# Patient Record
Sex: Female | Born: 1970
Health system: Southern US, Community
[De-identification: ages and names within clinical notes are randomized; demographics above are authoritative.]

## PROBLEM LIST (undated history)

## (undated) DIAGNOSIS — E039 Hypothyroidism, unspecified: Secondary | ICD-10-CM

## (undated) DIAGNOSIS — R7989 Other specified abnormal findings of blood chemistry: Secondary | ICD-10-CM

## (undated) DIAGNOSIS — Z8742 Personal history of other diseases of the female genital tract: Secondary | ICD-10-CM

## (undated) DIAGNOSIS — E538 Deficiency of other specified B group vitamins: Secondary | ICD-10-CM

## (undated) DIAGNOSIS — E559 Vitamin D deficiency, unspecified: Secondary | ICD-10-CM

## (undated) DIAGNOSIS — E282 Polycystic ovarian syndrome: Secondary | ICD-10-CM

## (undated) DIAGNOSIS — D219 Benign neoplasm of connective and other soft tissue, unspecified: Secondary | ICD-10-CM

## (undated) HISTORY — PX: TONSILLECTOMY: SUR1361

## (undated) HISTORY — DX: Vitamin D deficiency, unspecified: E55.9

## (undated) HISTORY — DX: Other specified abnormal findings of blood chemistry: R79.89

## (undated) HISTORY — DX: Hypothyroidism, unspecified: E03.9

## (undated) HISTORY — DX: Polycystic ovarian syndrome: E28.2

## (undated) HISTORY — DX: Personal history of other diseases of the female genital tract: Z87.42

## (undated) HISTORY — DX: Benign neoplasm of connective and other soft tissue, unspecified: D21.9

## (undated) HISTORY — DX: Deficiency of other specified B group vitamins: E53.8

---

## 2006-04-04 ENCOUNTER — Ambulatory Visit (HOSPITAL_COMMUNITY): Admission: RE | Admit: 2006-04-04 | Discharge: 2006-04-04 | Payer: Self-pay | Admitting: Family Medicine

## 2010-04-06 ENCOUNTER — Other Ambulatory Visit (HOSPITAL_COMMUNITY): Payer: Self-pay | Admitting: Internal Medicine

## 2010-04-06 DIAGNOSIS — Z1239 Encounter for other screening for malignant neoplasm of breast: Secondary | ICD-10-CM

## 2010-04-08 ENCOUNTER — Encounter: Payer: Self-pay | Admitting: Family Medicine

## 2010-04-23 ENCOUNTER — Ambulatory Visit (HOSPITAL_COMMUNITY)
Admission: RE | Admit: 2010-04-23 | Discharge: 2010-04-23 | Disposition: A | Discharge status: Home / Self Care | Payer: 59 | Source: Ambulatory Visit | Attending: Internal Medicine | Admitting: Internal Medicine

## 2010-04-23 DIAGNOSIS — Z1231 Encounter for screening mammogram for malignant neoplasm of breast: Secondary | ICD-10-CM | POA: Insufficient documentation

## 2010-04-23 DIAGNOSIS — Z1239 Encounter for other screening for malignant neoplasm of breast: Secondary | ICD-10-CM

## 2010-05-09 ENCOUNTER — Other Ambulatory Visit (HOSPITAL_COMMUNITY): Payer: Self-pay | Admitting: Internal Medicine

## 2010-05-09 DIAGNOSIS — R221 Localized swelling, mass and lump, neck: Secondary | ICD-10-CM

## 2010-05-25 ENCOUNTER — Ambulatory Visit (HOSPITAL_COMMUNITY)
Admission: RE | Admit: 2010-05-25 | Discharge: 2010-05-25 | Disposition: A | Discharge status: Home / Self Care | Payer: 59 | Source: Ambulatory Visit | Attending: Internal Medicine | Admitting: Internal Medicine

## 2010-05-25 DIAGNOSIS — E038 Other specified hypothyroidism: Secondary | ICD-10-CM | POA: Insufficient documentation

## 2010-05-25 DIAGNOSIS — R221 Localized swelling, mass and lump, neck: Secondary | ICD-10-CM

## 2010-05-25 DIAGNOSIS — E049 Nontoxic goiter, unspecified: Secondary | ICD-10-CM | POA: Insufficient documentation

## 2010-11-15 ENCOUNTER — Ambulatory Visit (INDEPENDENT_AMBULATORY_CARE_PROVIDER_SITE_OTHER): Payer: 59 | Admitting: Gastroenterology

## 2010-11-15 ENCOUNTER — Other Ambulatory Visit: Payer: Self-pay | Admitting: Gastroenterology

## 2010-11-15 ENCOUNTER — Encounter: Payer: Self-pay | Admitting: Gastroenterology

## 2010-11-15 VITALS — BP 134/89 | HR 96 | Temp 97.4°F | Ht 63.0 in | Wt 215.4 lb

## 2010-11-15 DIAGNOSIS — R197 Diarrhea, unspecified: Secondary | ICD-10-CM | POA: Insufficient documentation

## 2010-11-15 DIAGNOSIS — K6289 Other specified diseases of anus and rectum: Secondary | ICD-10-CM | POA: Insufficient documentation

## 2010-11-15 NOTE — Patient Instructions (Signed)
You most likely have IBS and had a bowel infection that made things worse.  Continue the probiotic and avoid items that cause bloating, diarrhea, and pain. You will have a blood test to check for celiac sprue. You will have a flexible sigmoidoscopy to check your rectum. Follow up in 3 mos.  Irritable Bowel Syndrome (Spastic Colon) Irritable Bowel Syndrome (IBS) is caused by a disturbance of normal bowel function. Other terms used are spastic colon, mucous colitis, and irritable colon. It does not require surgery, nor does it lead to cancer. There is no cure for IBS. But with proper diet, stress reduction, and medication, you will find that your problems (symptoms) will gradually disappear or improve. IBS is a common digestive disorder. It usually appears in late adolescence or early adulthood. Women develop it twice as often as men.  CAUSES After food has been digested and absorbed in the small intestine, waste material is moved into the colon (large intestine). In the colon, water and salts are absorbed from the undigested products coming from the small intestine. The remaining residue, or fecal material, is held for elimination. Under normal circumstances, gentle, rhythmic contractions on the bowel walls push the fecal material along the colon towards the rectum. In IBS, however, these contractions are irregular and poorly coordinated. The fecal material is either retained too long, resulting in constipation, or expelled too soon, producing diarrhea.  SYMPTOMS  The most common symptom of IBS is pain. It is typically in the lower left side of the belly (abdomen). But it may occur anywhere in the abdomen. It can be felt as heartburn, backache, or even as a dull pain in the arms or shoulders. The pain comes from excessive bowel-muscle spasms and from the buildup of gas and fecal material in the colon. This pain:  Can range from sharp belly (abdominal) cramps to a dull, continuous ache.   Usually  worsens soon after eating.   Is typically relieved by having a bowel movement or passing gas.    Abdominal pain is usually accompanied by constipation. But it may also produce diarrhea. The diarrhea typically occurs right after a meal or upon arising in the morning. The stools are typically soft and watery. They are often flecked with secretions (mucus). Other symptoms of IBS include:  Bloating.  Loss of appetite.   Heartburn.  Feeling sick to your stomach  (nausea).   Belching  Vomiting   Gas.  IBS may also cause a number of symptoms that are unrelated to the digestive system:  Fatigue.  Headaches.   Anxiety  Shortness of breath   Difficulty in concentrating.  Dizziness.   These symptoms tend to come and go.  DIAGNOSIS The symptoms of IBS closely mimic the symptoms of other, more serious digestive disorders. So your caregiver may wish to perform a variety of additional tests to exclude these disorders. He/she wants to be certain of learning what is wrong (diagnosis). The nature and purpose of each test will be explained to you.  TREATMENT A number of medications are available to help correct bowel function and/or relieve bowel spasms and abdominal pain. Among the drugs available are:  Mild, non-irritating laxatives for severe constipation and to help restore normal bowel habits.   Specific anti-diarrheal medications to treat severe or prolonged diarrhea.   Anti-spasmodic agents to relieve intestinal cramps.   HOME CARE INSTRUCTIONS   Avoid foods that are high in fat or oils. Some examples ZOX:WRUEA cream, butter, frankfurters, sausage, and other fatty meats.  Avoid foods that have a laxative effect, such as fruit, fruit juice, and dairy products.   Cut out carbonated drinks, chewing gum, and "gassy" foods, such as beans and cabbage. This may help relieve bloating and belching.   Bran taken with plenty of liquids may help relieve constipation.   Keep track of  what foods seem to trigger your symptoms.   Avoid emotionally charged situations or circumstances that produce anxiety.   Start or continue exercising.   Get plenty of rest and sleep.

## 2010-11-15 NOTE — Progress Notes (Addendum)
Subjective:    Patient ID: Tammy Blankenship, female    DOB: June 21, 1970, 40 y.o.   MRN: [id number]  PCP: [facility]ALL  HPI Sx of bowel irregularity since her 25s. ? Ate bad lettuce and had bad case of diarrhea. Rx: Anucort and Align. Sx improved but then ate nuts and felt swollen, tender, and bloated. When sat down felt like something on the inside of rectum. Dr. Margo Aye felt [facility] and saw ext hemorrhoids. Would have achiness in right buttock and across teh top of her butt. Last 2 days minimal diarrhea and more solid. No internal butt pain. No rectal bleeding. Having abd pain associated with joint pain and now mild left pain. Keeps a food journal-broccoli, squash, Zucchini(can't pass a BM, burp-->diarrhea). Spinach didn't bother her. If eats Mixed green, can see it in her BM. Diarrhea is gone. No well water.  Past Medical History  Diagnosis Date  . Hypothyroidism   . Attention and concentration deficit     Past Surgical History  Procedure Date  . Tonsillectomy     No Known Allergies  Current Outpatient Prescriptions  Medication Sig Dispense Refill  . citalopram (CELEXA) 20 MG tablet Take 20 mg by mouth daily.        Marland Kitchen levothyroxine (SYNTHROID, LEVOTHROID) 75 MCG tablet Take 75 mcg by mouth daily.        . Probiotic Product (ALIGN) 4 MG CAPS Take 4 mg by mouth daily.        . hydrocortisone (ANUSOL-HC) 25 MG suppository Place 25 mg rectally 2 (two) times daily.         Family History  Problem Relation Age of Onset  . Breast cancer Mother   . Rectal cancer Maternal Uncle   . Colon cancer Maternal Uncle     3 uncles    History   Social History  . Marital Status: Single    Spouse Name: N/A    Number of Children: N/A  . Years of Education: N/A   Occupational History  . Not on file.   Social History Main Topics  . Smoking status: Former Smoker    Types: Cigarettes  . Smokeless tobacco: Former Neurosurgeon    Quit date: 11/14/2008   Comment: 3-4 cigarttes a day  . Alcohol Use: Yes     evey  other month  . Drug Use: No  . Sexually Active: Not on file   Other Topics Concern  . Not on file   Social History Narrative   No kids. Has a long ter partner. Works for [employer].        Review of Systems  All other systems reviewed and are negative.  JAN 2012 TSH 6.398H-->APR 2012 2.167 NOV 2011-NL CMP, CBC(PLT 262)     Objective:   Physical Exam  Vitals reviewed. Constitutional: She is oriented to person, place, and time. She appears well-developed and well-nourished. No distress.  HENT:  Head: Normocephalic and atraumatic.  Mouth/Throat: Oropharynx is clear and moist. No oropharyngeal exudate.  Eyes: Pupils are equal, round, and reactive to light. No scleral icterus.  Neck: Normal range of motion. Neck supple.  Cardiovascular: Normal rate, regular rhythm and normal heart sounds.   Pulmonary/Chest: Effort normal and breath sounds normal.  Abdominal: Soft. Bowel sounds are normal. She exhibits no distension. There is no tenderness.  Musculoskeletal: Normal range of motion. She exhibits no edema.  Lymphadenopathy:    She has no cervical adenopathy.  Neurological: She is alert and oriented to person, place, and time.  No focal deficits  Psychiatric: She has a normal mood and affect.          Assessment & Plan:

## 2010-11-15 NOTE — Progress Notes (Signed)
Pt aware of OV for 11/28 at 0930 with SF

## 2010-11-15 NOTE — Assessment & Plan Note (Signed)
Most likely 2o to enteric infection followed by IBS flare. Sx improved.  Continue probiotic. TTG IGA to test for celiac sprue. OPV in 3 mos.

## 2010-11-15 NOTE — Assessment & Plan Note (Signed)
Most likely 2o to hemorrhoids. Less likely rectal mass.  FLEX SIG IN SEP 2012. USE ANUCORT PRN. HIGH FIBER DIET. AVOID ITEMS THAT CAUSE BLOATING, DIARRHEA, AND PAIN.

## 2010-11-16 LAB — TISSUE TRANSGLUTAMINASE, IGA: Tissue Transglutaminase Ab, IgA: 4.1 U/mL (ref <20)

## 2010-11-20 NOTE — Progress Notes (Signed)
Cc to PCP 

## 2010-11-22 NOTE — Progress Notes (Signed)
Quick Note:  LMOM to call. ______ 

## 2010-12-13 MED ORDER — SODIUM CHLORIDE 0.45 % IV SOLN
Freq: Once | INTRAVENOUS | Status: AC
  Administered 2010-12-14: 08:00:00 via INTRAVENOUS

## 2010-12-14 ENCOUNTER — Encounter (HOSPITAL_COMMUNITY)
Admission: RE | Disposition: A | Discharge status: Home / Self Care | Payer: Self-pay | Source: Ambulatory Visit | Attending: Gastroenterology

## 2010-12-14 ENCOUNTER — Encounter (HOSPITAL_COMMUNITY): Payer: Self-pay

## 2010-12-14 ENCOUNTER — Other Ambulatory Visit: Payer: Self-pay | Admitting: Gastroenterology

## 2010-12-14 ENCOUNTER — Ambulatory Visit (HOSPITAL_COMMUNITY)
Admission: RE | Admit: 2010-12-14 | Discharge: 2010-12-14 | Disposition: A | Discharge status: Home / Self Care | Payer: 59 | Source: Ambulatory Visit | Attending: Gastroenterology | Admitting: Gastroenterology

## 2010-12-14 DIAGNOSIS — K6289 Other specified diseases of anus and rectum: Secondary | ICD-10-CM | POA: Insufficient documentation

## 2010-12-14 DIAGNOSIS — K648 Other hemorrhoids: Secondary | ICD-10-CM | POA: Insufficient documentation

## 2010-12-14 DIAGNOSIS — K5730 Diverticulosis of large intestine without perforation or abscess without bleeding: Secondary | ICD-10-CM

## 2010-12-14 DIAGNOSIS — R197 Diarrhea, unspecified: Secondary | ICD-10-CM | POA: Insufficient documentation

## 2010-12-14 DIAGNOSIS — D128 Benign neoplasm of rectum: Secondary | ICD-10-CM | POA: Insufficient documentation

## 2010-12-14 DIAGNOSIS — D126 Benign neoplasm of colon, unspecified: Secondary | ICD-10-CM

## 2010-12-14 DIAGNOSIS — D129 Benign neoplasm of anus and anal canal: Secondary | ICD-10-CM | POA: Insufficient documentation

## 2010-12-14 HISTORY — PX: FLEXIBLE SIGMOIDOSCOPY: SHX5431

## 2010-12-14 SURGERY — SIGMOIDOSCOPY, FLEXIBLE
Anesthesia: Moderate Sedation

## 2010-12-14 MED ORDER — MEPERIDINE HCL 100 MG/ML IJ SOLN
INTRAMUSCULAR | Status: DC | PRN
  Administered 2010-12-14: 25 mg
  Administered 2010-12-14: 50 mg

## 2010-12-14 MED ORDER — MIDAZOLAM HCL 5 MG/5ML IJ SOLN
INTRAMUSCULAR | Status: AC
  Filled 2010-12-14: qty 10

## 2010-12-14 MED ORDER — MIDAZOLAM HCL 5 MG/5ML IJ SOLN
INTRAMUSCULAR | Status: DC | PRN
  Administered 2010-12-14 (×2): 2 mg via INTRAVENOUS

## 2010-12-14 MED ORDER — MEPERIDINE HCL 100 MG/ML IJ SOLN
INTRAMUSCULAR | Status: AC
  Filled 2010-12-14: qty 2

## 2010-12-14 NOTE — Interval H&P Note (Signed)
History and Physical Interval Note:   12/14/2010   8:36 AM   [name]  has presented today for surgery, with the diagnosis of rectal pain & diarrhea  The various methods of treatment have been discussed with the patient and family. After consideration of risks, benefits and other options for treatment, the patient has consented to  Procedure(s): FLEXIBLE SIGMOIDOSCOPY as a surgical intervention .  I have reviewed the patients' chart and labs.  Questions were answered to the patient's satisfaction.     Jonette Eva  MD

## 2010-12-14 NOTE — H&P (Signed)
Reason for Visit     Hemorrhoids        Vitals - Last Recorded       BP Pulse Temp(Src) Ht Wt BMI    134/89  96  97.4 F (36.3 C) (Temporal)  5\' 3"  (1.6 m)  215 lb 6.4 oz (97.705 kg)  38.16 kg/m2          LMP              12/10/2010                    Progress Notes     Jonette Eva, MD  11/22/2010  1:07 PM  Addendum    Subjective:      Patient ID: Tammy Blankenship, female    DOB: 1970-03-20, 40 y.o.   MRN: [id number]   PCP: [facility]   HPI Sx of bowel irregularity since her 64s. ? Ate bad lettuce and had bad case of diarrhea. Rx: Anucort and Align. Sx improved but then ate nuts and felt swollen, tender, and bloated. When sat down felt like something on the inside of rectum. Dr. Margo Aye felt [facility] and saw ext hemorrhoids. Would have achiness in right buttock and across teh top of her butt. Last 2 days minimal diarrhea and more solid. No internal butt pain. No rectal bleeding. Having abd pain associated with joint pain and now mild left pain. Keeps a food journal-broccoli, squash, Zucchini(can't pass a BM, burp-->diarrhea). Spinach didn't bother her. If eats Mixed green, can see it in her BM. Diarrhea is gone. No well water.    Past Medical History   Diagnosis  Date   .  Hypothyroidism     .  Attention and concentration deficit         Past Surgical History   Procedure  Date   .  Tonsillectomy        No Known Allergies    Current Outpatient Prescriptions   Medication  Sig  Dispense  Refill   .  citalopram (CELEXA) 20 MG tablet  Take 20 mg by mouth daily.           Marland Kitchen  levothyroxine (SYNTHROID, LEVOTHROID) 75 MCG tablet  Take 75 mcg by mouth daily.           .  Probiotic Product (ALIGN) 4 MG CAPS  Take 4 mg by mouth daily.           .  hydrocortisone (ANUSOL-HC) 25 MG suppository  Place 25 mg rectally 2 (two) times daily.            Family History   Problem  Relation  Age of Onset   .  Breast cancer  Mother     .  Rectal cancer  Maternal Uncle     .  Colon cancer   Maternal Uncle         3 uncles       History       Social History   .  Marital Status:  Single       Spouse Name:  N/A       Number of Children:  N/A   .  Years of Education:  N/A       Occupational History   .  Not on file.       Social History Main Topics   .  Smoking status:  Former Smoker       Types:  Cigarettes   .  Smokeless tobacco:  Former Neurosurgeon       Quit date:  11/14/2008     Comment: 3-4 cigarttes a day   .  Alcohol Use:  Yes         evey other month   .  Drug Use:  No   .  Sexually Active:  Not on file       Other Topics  Concern   .  Not on file       Social History Narrative     No kids. Has a long ter partner. Works for [employer].              Review of Systems  All other systems reviewed and are negative. JAN 2012 TSH 6.398H-->APR 2012 2.167 NOV 2011-NL CMP, CBC(PLT 262)     Objective:    Physical Exam  Vitals reviewed. Constitutional: She is oriented to person, place, and time. She appears well-developed and well-nourished. No distress.  HENT:   Head: Normocephalic and atraumatic.   Mouth/Throat: Oropharynx is clear and moist. No oropharyngeal exudate.  Eyes: Pupils are equal, round, and reactive to light. No scleral icterus.  Neck: Normal range of motion. Neck supple.  Cardiovascular: Normal rate, regular rhythm and normal heart sounds.   Pulmonary/Chest: Effort normal and breath sounds normal.  Abdominal: Soft. Bowel sounds are normal. She exhibits no distension. There is no tenderness.  Musculoskeletal: Normal range of motion. She exhibits no edema.  Lymphadenopathy:    She has no cervical adenopathy.  Neurological: She is alert and oriented to person, place, and time.       No focal deficits  Psychiatric: She has a normal mood and affect.            Assessment & Plan:        Previous Version  [name]  11/15/2010  3:20 PM  Signed Pt aware of OV for 11/28 at 0930 with SF  Leigh A Watson  11/20/2010 12:15 PM   Signed Cc to PCP  Cloria Spring, LPN, LPN  0/11/8117  1:47 PM  Signed Quick Note:   LMOM to call. ______        Diarrhea - Jonette Eva, MD  11/15/2010  9:33 AM  Signed Most likely 2o to enteric infection followed by IBS flare. Sx improved.   Continue probiotic. TTG IGA to test for celiac sprue. OPV in 3 mos.  Rectal pain - Jonette Eva, MD  11/15/2010  9:34 AM  Signed Most likely 2o to hemorrhoids. Less likely rectal mass.   FLEX SIG IN SEP 2012. USE ANUCORT PRN. HIGH FIBER DIET. AVOID ITEMS THAT CAUSE BLOATING, DIARRHEA, AND PAIN.

## 2010-12-20 ENCOUNTER — Telehealth: Payer: Self-pay | Admitting: Gastroenterology

## 2010-12-20 NOTE — Telephone Encounter (Signed)
Please call pt. She had HYPERPLASTIC POLYP removed from her colon. Her colon biopsies are normal. TCS in 9 years. High fiber diet. OPV NOV 2012,

## 2010-12-21 NOTE — Telephone Encounter (Signed)
Results Cc to PCP & 9 yr reminder is nicd in the computer

## 2010-12-24 ENCOUNTER — Encounter (HOSPITAL_COMMUNITY): Payer: Self-pay | Admitting: Gastroenterology

## 2010-12-24 NOTE — Telephone Encounter (Signed)
L/M to call.

## 2010-12-24 NOTE — Telephone Encounter (Signed)
Pt informed

## 2011-02-13 ENCOUNTER — Ambulatory Visit: Payer: 59 | Admitting: Gastroenterology

## 2011-02-13 ENCOUNTER — Telehealth: Payer: Self-pay | Admitting: Gastroenterology

## 2011-02-13 NOTE — Telephone Encounter (Signed)
REVIEWED.  

## 2011-02-13 NOTE — Telephone Encounter (Signed)
Pt was a no show

## 2011-05-13 ENCOUNTER — Other Ambulatory Visit (HOSPITAL_COMMUNITY): Payer: Self-pay | Admitting: Internal Medicine

## 2011-05-13 DIAGNOSIS — Z139 Encounter for screening, unspecified: Secondary | ICD-10-CM

## 2011-05-23 ENCOUNTER — Ambulatory Visit (HOSPITAL_COMMUNITY)
Admission: RE | Admit: 2011-05-23 | Discharge: 2011-05-23 | Disposition: A | Discharge status: Home / Self Care | Payer: 59 | Source: Ambulatory Visit | Attending: Internal Medicine | Admitting: Internal Medicine

## 2011-05-23 DIAGNOSIS — Z1231 Encounter for screening mammogram for malignant neoplasm of breast: Secondary | ICD-10-CM | POA: Insufficient documentation

## 2011-05-23 DIAGNOSIS — Z139 Encounter for screening, unspecified: Secondary | ICD-10-CM

## 2013-08-27 ENCOUNTER — Other Ambulatory Visit (HOSPITAL_COMMUNITY): Payer: Self-pay | Admitting: Internal Medicine

## 2013-08-27 DIAGNOSIS — Z1231 Encounter for screening mammogram for malignant neoplasm of breast: Secondary | ICD-10-CM

## 2013-08-30 ENCOUNTER — Ambulatory Visit (HOSPITAL_COMMUNITY)
Admission: RE | Admit: 2013-08-30 | Discharge: 2013-08-30 | Disposition: A | Discharge status: Home / Self Care | Payer: 59 | Source: Ambulatory Visit | Attending: Internal Medicine | Admitting: Internal Medicine

## 2013-08-30 DIAGNOSIS — Z1231 Encounter for screening mammogram for malignant neoplasm of breast: Secondary | ICD-10-CM | POA: Insufficient documentation

## 2013-10-26 ENCOUNTER — Other Ambulatory Visit (HOSPITAL_COMMUNITY): Payer: Self-pay | Admitting: Internal Medicine

## 2013-10-26 DIAGNOSIS — N946 Dysmenorrhea, unspecified: Secondary | ICD-10-CM

## 2013-10-29 ENCOUNTER — Ambulatory Visit (HOSPITAL_COMMUNITY)
Admission: RE | Admit: 2013-10-29 | Discharge: 2013-10-29 | Disposition: A | Discharge status: Home / Self Care | Payer: 59 | Source: Ambulatory Visit | Attending: Internal Medicine | Admitting: Internal Medicine

## 2013-10-29 DIAGNOSIS — N839 Noninflammatory disorder of ovary, fallopian tube and broad ligament, unspecified: Secondary | ICD-10-CM | POA: Insufficient documentation

## 2013-10-29 DIAGNOSIS — N946 Dysmenorrhea, unspecified: Secondary | ICD-10-CM | POA: Diagnosis not present

## 2013-11-30 ENCOUNTER — Other Ambulatory Visit (HOSPITAL_COMMUNITY): Payer: Self-pay | Admitting: Internal Medicine

## 2013-11-30 DIAGNOSIS — N83209 Unspecified ovarian cyst, unspecified side: Secondary | ICD-10-CM

## 2013-12-13 ENCOUNTER — Other Ambulatory Visit (HOSPITAL_COMMUNITY): Payer: Self-pay | Admitting: Internal Medicine

## 2013-12-13 DIAGNOSIS — N83209 Unspecified ovarian cyst, unspecified side: Secondary | ICD-10-CM

## 2013-12-27 ENCOUNTER — Ambulatory Visit (HOSPITAL_COMMUNITY): Admission: RE | Admit: 2013-12-27 | Payer: 59 | Source: Ambulatory Visit

## 2013-12-27 ENCOUNTER — Other Ambulatory Visit (HOSPITAL_COMMUNITY): Payer: 59

## 2014-01-04 ENCOUNTER — Other Ambulatory Visit (HOSPITAL_COMMUNITY): Payer: Self-pay | Admitting: Emergency Medicine

## 2014-01-04 ENCOUNTER — Ambulatory Visit (HOSPITAL_COMMUNITY)
Admission: RE | Admit: 2014-01-04 | Discharge: 2014-01-04 | Disposition: A | Discharge status: Home / Self Care | Payer: 59 | Source: Ambulatory Visit | Attending: Internal Medicine | Admitting: Internal Medicine

## 2014-01-04 DIAGNOSIS — R609 Edema, unspecified: Secondary | ICD-10-CM

## 2014-01-04 DIAGNOSIS — M25572 Pain in left ankle and joints of left foot: Secondary | ICD-10-CM

## 2014-01-04 DIAGNOSIS — M7732 Calcaneal spur, left foot: Secondary | ICD-10-CM | POA: Insufficient documentation

## 2014-10-25 ENCOUNTER — Other Ambulatory Visit (HOSPITAL_COMMUNITY): Payer: Self-pay | Admitting: Internal Medicine

## 2014-10-25 DIAGNOSIS — Z1231 Encounter for screening mammogram for malignant neoplasm of breast: Secondary | ICD-10-CM

## 2014-11-02 ENCOUNTER — Ambulatory Visit (HOSPITAL_COMMUNITY)
Admission: RE | Admit: 2014-11-02 | Discharge: 2014-11-02 | Disposition: A | Discharge status: Home / Self Care | Payer: 59 | Source: Ambulatory Visit | Attending: Internal Medicine | Admitting: Internal Medicine

## 2014-11-02 DIAGNOSIS — Z1231 Encounter for screening mammogram for malignant neoplasm of breast: Secondary | ICD-10-CM | POA: Diagnosis present

## 2015-04-12 MED FILL — CITALOPRAM HBR 40 MG TABLET: 40 | 30 days supply | Qty: 30 | Fill #2

## 2015-09-15 ENCOUNTER — Other Ambulatory Visit (HOSPITAL_COMMUNITY)
Admission: RE | Admit: 2015-09-15 | Discharge: 2015-09-15 | Disposition: A | Discharge status: Home / Self Care | Payer: 59 | Source: Ambulatory Visit | Attending: Adult Health | Admitting: Adult Health

## 2015-09-15 ENCOUNTER — Encounter: Payer: Self-pay | Admitting: Adult Health

## 2015-09-15 ENCOUNTER — Ambulatory Visit (INDEPENDENT_AMBULATORY_CARE_PROVIDER_SITE_OTHER): Payer: 59 | Admitting: Adult Health

## 2015-09-15 VITALS — BP 148/84 | HR 80 | Ht 61.5 in | Wt 194.0 lb

## 2015-09-15 DIAGNOSIS — Z01419 Encounter for gynecological examination (general) (routine) without abnormal findings: Secondary | ICD-10-CM

## 2015-09-15 DIAGNOSIS — Z1212 Encounter for screening for malignant neoplasm of rectum: Secondary | ICD-10-CM | POA: Diagnosis not present

## 2015-09-15 DIAGNOSIS — Z01411 Encounter for gynecological examination (general) (routine) with abnormal findings: Secondary | ICD-10-CM | POA: Diagnosis not present

## 2015-09-15 DIAGNOSIS — Z8742 Personal history of other diseases of the female genital tract: Secondary | ICD-10-CM

## 2015-09-15 DIAGNOSIS — Z124 Encounter for screening for malignant neoplasm of cervix: Secondary | ICD-10-CM | POA: Diagnosis not present

## 2015-09-15 DIAGNOSIS — Z1151 Encounter for screening for human papillomavirus (HPV): Secondary | ICD-10-CM | POA: Insufficient documentation

## 2015-09-15 HISTORY — DX: Personal history of other diseases of the female genital tract: Z87.42

## 2015-09-15 LAB — HEMOCCULT GUIAC POC 1CARD (OFFICE): Fecal Occult Blood, POC: NEGATIVE

## 2015-09-15 NOTE — Progress Notes (Signed)
Patient ID: Tammy Blankenship, female   DOB: 04/30/1970, 45 y.o.   MRN: [id number] History of Present Illness: Tammy Blankenship is a 45 year old white female, single in for a well woman gyn exam and pap.She is in RT at [facility] PCP is Tammy Hall,MD.   Current Medications, Allergies, Past Medical History, Past Surgical History, Family History and Social History were reviewed in [employer] record.     Review of Systems: Patient denies any headaches, hearing loss, fatigue, blurred vision, shortness of breath, chest pain, abdominal pain, problems with bowel movements, urination, or intercourse. No joint pain or mood swings.Periods still every month, may spot  After bleeding slowed, and occasional cramp,had ovarian cyst in 2015 without F/U US, has been told had PCO.    Physical Exam:BP 148/84 mmHg  Pulse 80  Ht 5' 1.5" (1.562 m)  Wt 194 lb (87.998 kg)  BMI 36.07 kg/m2  LMP 08/31/2015 General:  Well developed, well nourished, no acute distress Skin:  Warm and dry,has increased hair growth on arms, and stomach  Neck:  Midline trachea, normal thyroid, good ROM, no lymphadenopathy Lungs; Clear to auscultation bilaterally Breast:  No dominant palpable mass, retraction, or nipple discharge Cardiovascular: Regular rate and rhythm Abdomen:  Soft, non tender, no hepatosplenomegaly Pelvic:  External genitalia is normal in appearance, no lesions.  The vagina is normal in appearance. Urethra has no lesions or masses. The cervix is smooth,pap with HPV performed.  Uterus is felt to be normal size, shape, and contour.  No adnexal masses or tenderness noted.Bladder is non tender, no masses felt. Rectal: Good sphincter tone, no polyps, or hemorrhoids felt.  Hemoccult negative. Extremities/musculoskeletal:  No swelling or varicosities noted, no clubbing or cyanosis Psych:  No mood changes, alert and cooperative,seems happy   Impression: Well woman gyn exam and pap History of ovarian  cyst    Plan: Check CBC,CMP,TSH and lipids,A1c and vitamin D Physical in 1 year,pap in 3 if normal Mammogram yearly Colonoscopy at 20

## 2015-09-15 NOTE — Patient Instructions (Addendum)
Physical in 1 year,pap in 3 if normal Mammogram yearly  Return for [place] in 1 week  Ovarian Cyst An ovarian cyst is a fluid-filled sac that forms on an ovary. The ovaries are small organs that produce eggs in women. Various types of cysts can form on the ovaries. Most are not cancerous. Many do not cause problems, and they often go away on their own. Some may cause symptoms and require treatment. Common types of ovarian cysts include:  Functional cysts--These cysts may occur every month during the menstrual cycle. This is normal. The cysts usually go away with the next menstrual cycle if the woman does not get pregnant. Usually, there are no symptoms with a functional cyst.  Endometrioma cysts--These cysts form from the tissue that lines the uterus. They are also called "chocolate cysts" because they become filled with blood that turns brown. This type of cyst can cause pain in the lower abdomen during intercourse and with your menstrual period.  Cystadenoma cysts--This type develops from the cells on the outside of the ovary. These cysts can get very big and cause lower abdomen pain and pain with intercourse. This type of cyst can twist on itself, cut off its blood supply, and cause severe pain. It can also easily rupture and cause a lot of pain.  Dermoid cysts--This type of cyst is sometimes found in both ovaries. These cysts may contain different kinds of body tissue, such as skin, teeth, hair, or cartilage. They usually do not cause symptoms unless they get very big.  Theca lutein cysts--These cysts occur when too much of a certain hormone (human chorionic gonadotropin) is produced and overstimulates the ovaries to produce an egg. This is most common after procedures used to assist with the conception of a baby (in vitro fertilization). CAUSES   Fertility drugs can cause a condition in which multiple large cysts are formed on the ovaries. This is called ovarian hyperstimulation syndrome.  A  condition called polycystic ovary syndrome can cause hormonal imbalances that can lead to nonfunctional ovarian cysts. SIGNS AND SYMPTOMS  Many ovarian cysts do not cause symptoms. If symptoms are present, they may include:  Pelvic pain or pressure.  Pain in the lower abdomen.  Pain during sexual intercourse.  Increasing girth (swelling) of the abdomen.  Abnormal menstrual periods.  Increasing pain with menstrual periods.  Stopping having menstrual periods without being pregnant. DIAGNOSIS  These cysts are commonly found during a routine or annual pelvic exam. Tests may be ordered to find out more about the cyst. These tests may include:  Ultrasound.  X-ray of the pelvis.  CT scan.  MRI.  Blood tests. TREATMENT  Many ovarian cysts go away on their own without treatment. Your health care provider may want to check your cyst regularly for 2-3 months to see if it changes. For women in menopause, it is particularly important to monitor a cyst closely because of the higher rate of ovarian cancer in menopausal women. When treatment is needed, it may include any of the following:  A procedure to drain the cyst (aspiration). This may be done using a long needle and ultrasound. It can also be done through a laparoscopic procedure. This involves using a thin, lighted tube with a tiny camera on the end (laparoscope) inserted through a small incision.  Surgery to remove the whole cyst. This may be done using laparoscopic surgery or an open surgery involving a larger incision in the lower abdomen.  Hormone treatment or birth control pills.  These methods are sometimes used to help dissolve a cyst. HOME CARE INSTRUCTIONS   Only take over-the-counter or prescription medicines as directed by your health care provider.  Follow up with your health care provider as directed.  Get regular pelvic exams and Pap tests. SEEK MEDICAL CARE IF:   Your periods are late, irregular, or painful, or  they stop.  Your pelvic pain or abdominal pain does not go away.  Your abdomen becomes larger or swollen.  You have pressure on your bladder or trouble emptying your bladder completely.  You have pain during sexual intercourse.  You have feelings of fullness, pressure, or discomfort in your stomach.  You lose weight for no apparent reason.  You feel generally ill.  You become constipated.  You lose your appetite.  You develop acne.  You have an increase in body and facial hair.  You are gaining weight, without changing your exercise and eating habits.  You think you are pregnant. SEEK IMMEDIATE MEDICAL CARE IF:   You have increasing abdominal pain.  You feel sick to your stomach (nauseous), and you throw up (vomit).  You develop a fever that comes on suddenly.  You have abdominal pain during a bowel movement.  Your menstrual periods become heavier than usual. MAKE SURE YOU:  Understand these instructions.  Will watch your condition.  Will get help right away if you are not doing well or get worse.   This information is not intended to replace advice given to you by your health care provider. Make sure you discuss any questions you have with your health care provider.   Document Released: 03/04/2005 Document Revised: 03/09/2013 Document Reviewed: 11/09/2012 Elsevier Interactive Patient Education [employer].

## 2015-09-16 LAB — HEMOGLOBIN A1C
Est. average glucose Bld gHb Est-mCnc: 105 mg/dL
Hgb A1c MFr Bld: 5.3 % (ref 4.8–5.6)

## 2015-09-16 LAB — LIPID PANEL
CHOL/HDL RATIO: 3.5 ratio (ref 0.0–4.4)
Cholesterol, Total: 188 mg/dL (ref 100–199)
HDL: 54 mg/dL (ref >39)
LDL CALC: 112 mg/dL — AB (ref 0–99)
TRIGLYCERIDES: 112 mg/dL (ref 0–149)
VLDL CHOLESTEROL CAL: 22 mg/dL (ref 5–40)

## 2015-09-16 LAB — CBC
HEMATOCRIT: 43.4 % (ref 34.0–46.6)
Hemoglobin: 14.9 g/dL (ref 11.1–15.9)
MCH: 30.2 pg (ref 26.6–33.0)
MCHC: 34.3 g/dL (ref 31.5–35.7)
MCV: 88 fL (ref 79–97)
PLATELETS: 297 10*3/uL (ref 150–379)
RBC: 4.94 x10E6/uL (ref 3.77–5.28)
RDW: 13.4 % (ref 12.3–15.4)
WBC: 6.3 10*3/uL (ref 3.4–10.8)

## 2015-09-16 LAB — COMPREHENSIVE METABOLIC PANEL
A/G RATIO: 1.5 (ref 1.2–2.2)
ALT: 8 IU/L (ref 0–32)
AST: 16 IU/L (ref 0–40)
Albumin: 4 g/dL (ref 3.5–5.5)
Alkaline Phosphatase: 55 IU/L (ref 39–117)
BILIRUBIN TOTAL: 0.4 mg/dL (ref 0.0–1.2)
BUN/Creatinine Ratio: 10 (ref 9–23)
BUN: 7 mg/dL (ref 6–24)
CALCIUM: 9.1 mg/dL (ref 8.7–10.2)
CHLORIDE: 100 mmol/L (ref 96–106)
CO2: 23 mmol/L (ref 18–29)
Creatinine, Ser: 0.7 mg/dL (ref 0.57–1.00)
GFR, EST AFRICAN AMERICAN: 121 mL/min/{1.73_m2} (ref >59)
GFR, EST NON AFRICAN AMERICAN: 105 mL/min/{1.73_m2} (ref >59)
GLOBULIN, TOTAL: 2.7 g/dL (ref 1.5–4.5)
Glucose: 90 mg/dL (ref 65–99)
POTASSIUM: 4.7 mmol/L (ref 3.5–5.2)
SODIUM: 140 mmol/L (ref 134–144)
TOTAL PROTEIN: 6.7 g/dL (ref 6.0–8.5)

## 2015-09-16 LAB — TSH: TSH: 3.69 u[IU]/mL (ref 0.450–4.500)

## 2015-09-16 LAB — VITAMIN D 25 HYDROXY (VIT D DEFICIENCY, FRACTURES): Vit D, 25-Hydroxy: 24.7 ng/mL — ABNORMAL LOW (ref 30.0–100.0)

## 2015-09-18 ENCOUNTER — Telehealth: Payer: Self-pay | Admitting: Adult Health

## 2015-09-18 LAB — CYTOLOGY - PAP

## 2015-09-18 NOTE — Telephone Encounter (Signed)
Left message about labs and take vitamin D3 2000- 5000 IU daily

## 2015-09-22 ENCOUNTER — Ambulatory Visit (INDEPENDENT_AMBULATORY_CARE_PROVIDER_SITE_OTHER): Payer: 59

## 2015-09-22 ENCOUNTER — Encounter: Payer: Self-pay | Admitting: Adult Health

## 2015-09-22 ENCOUNTER — Telehealth: Payer: Self-pay | Admitting: Adult Health

## 2015-09-22 DIAGNOSIS — D219 Benign neoplasm of connective and other soft tissue, unspecified: Secondary | ICD-10-CM

## 2015-09-22 DIAGNOSIS — Z8742 Personal history of other diseases of the female genital tract: Secondary | ICD-10-CM | POA: Diagnosis not present

## 2015-09-22 DIAGNOSIS — D252 Subserosal leiomyoma of uterus: Secondary | ICD-10-CM

## 2015-09-22 DIAGNOSIS — N854 Malposition of uterus: Secondary | ICD-10-CM | POA: Diagnosis not present

## 2015-09-22 HISTORY — DX: Benign neoplasm of connective and other soft tissue, unspecified: D21.9

## 2015-09-22 NOTE — Progress Notes (Signed)
US PELVIC US TA/TV: Heterogenous anteverted uterus w a subserosal fundal fibroid 2.1 x 1.8 x 2.3 cm,normal ov's bilat (mobile),EEC 14.6 mm,mult simple nabothian cysts,small amount of cul de sac fluid

## 2015-09-22 NOTE — Telephone Encounter (Signed)
Left message that cyst gone on ovary and they were normal has small fundal fibroid

## 2015-09-25 ENCOUNTER — Other Ambulatory Visit: Payer: 59

## 2015-10-09 ENCOUNTER — Other Ambulatory Visit (HOSPITAL_COMMUNITY): Payer: Self-pay | Admitting: Internal Medicine

## 2015-10-09 ENCOUNTER — Other Ambulatory Visit: Payer: Self-pay | Admitting: Adult Health

## 2015-10-09 DIAGNOSIS — Z1231 Encounter for screening mammogram for malignant neoplasm of breast: Secondary | ICD-10-CM

## 2015-11-15 ENCOUNTER — Other Ambulatory Visit: Payer: Self-pay | Admitting: Adult Health

## 2015-11-15 ENCOUNTER — Encounter (HOSPITAL_COMMUNITY): Payer: Self-pay

## 2015-11-15 ENCOUNTER — Ambulatory Visit (HOSPITAL_COMMUNITY)
Admission: RE | Admit: 2015-11-15 | Discharge: 2015-11-15 | Disposition: A | Discharge status: Home / Self Care | Payer: 59 | Source: Ambulatory Visit | Attending: Adult Health | Admitting: Adult Health

## 2015-11-15 DIAGNOSIS — Z1231 Encounter for screening mammogram for malignant neoplasm of breast: Secondary | ICD-10-CM

## 2015-11-16 ENCOUNTER — Telehealth: Payer: Self-pay | Admitting: *Deleted

## 2015-11-16 NOTE — Telephone Encounter (Signed)
Pt requesting information for advance Directive- MOST form. Pt given number to [facility].

## 2015-11-17 ENCOUNTER — Ambulatory Visit (HOSPITAL_COMMUNITY)
Admission: RE | Admit: 2015-11-17 | Discharge: 2015-11-17 | Disposition: A | Discharge status: Home / Self Care | Payer: 59 | Source: Ambulatory Visit | Attending: Adult Health | Admitting: Adult Health

## 2015-11-17 DIAGNOSIS — Z1231 Encounter for screening mammogram for malignant neoplasm of breast: Secondary | ICD-10-CM | POA: Insufficient documentation

## 2017-02-20 ENCOUNTER — Other Ambulatory Visit: Payer: Self-pay | Admitting: Adult Health

## 2017-02-20 DIAGNOSIS — Z1231 Encounter for screening mammogram for malignant neoplasm of breast: Secondary | ICD-10-CM

## 2017-03-31 ENCOUNTER — Ambulatory Visit (HOSPITAL_COMMUNITY)
Admission: RE | Admit: 2017-03-31 | Discharge: 2017-03-31 | Disposition: A | Discharge status: Home / Self Care | Payer: 59 | Source: Ambulatory Visit | Attending: Adult Health | Admitting: Adult Health

## 2017-03-31 ENCOUNTER — Ambulatory Visit (INDEPENDENT_AMBULATORY_CARE_PROVIDER_SITE_OTHER): Payer: 59 | Admitting: Adult Health

## 2017-03-31 ENCOUNTER — Encounter: Payer: Self-pay | Admitting: Adult Health

## 2017-03-31 ENCOUNTER — Other Ambulatory Visit (HOSPITAL_COMMUNITY)
Admission: RE | Admit: 2017-03-31 | Discharge: 2017-03-31 | Disposition: A | Discharge status: Home / Self Care | Payer: 59 | Source: Ambulatory Visit | Attending: Adult Health | Admitting: Adult Health

## 2017-03-31 VITALS — BP 148/90 | HR 84 | Ht 61.5 in | Wt 177.0 lb

## 2017-03-31 DIAGNOSIS — Z1231 Encounter for screening mammogram for malignant neoplasm of breast: Secondary | ICD-10-CM | POA: Insufficient documentation

## 2017-03-31 DIAGNOSIS — E7800 Pure hypercholesterolemia, unspecified: Secondary | ICD-10-CM | POA: Diagnosis not present

## 2017-03-31 DIAGNOSIS — Z1212 Encounter for screening for malignant neoplasm of rectum: Secondary | ICD-10-CM | POA: Diagnosis not present

## 2017-03-31 DIAGNOSIS — Z01411 Encounter for gynecological examination (general) (routine) with abnormal findings: Secondary | ICD-10-CM

## 2017-03-31 DIAGNOSIS — Z1211 Encounter for screening for malignant neoplasm of colon: Secondary | ICD-10-CM

## 2017-03-31 DIAGNOSIS — Z8742 Personal history of other diseases of the female genital tract: Secondary | ICD-10-CM

## 2017-03-31 DIAGNOSIS — Z01419 Encounter for gynecological examination (general) (routine) without abnormal findings: Secondary | ICD-10-CM | POA: Insufficient documentation

## 2017-03-31 DIAGNOSIS — Z124 Encounter for screening for malignant neoplasm of cervix: Secondary | ICD-10-CM

## 2017-03-31 LAB — HEMOCCULT GUIAC POC 1CARD (OFFICE): Fecal Occult Blood, POC: NEGATIVE

## 2017-03-31 NOTE — Progress Notes (Addendum)
Patient ID: Tammy Blankenship, female   DOB: 19-Dec-1970, 47 y.o.   MRN: [id number] History of Present Illness:  Tammy Blankenship is a 47 year old white female in for well woman gyn exam and she requests pap.She had mammogram this morning.  PCP is [employer].   Current Medications, Allergies, Past Medical History, Past Surgical History, Family History and Social History were reviewed in [employer] record.     Review of Systems: Patient denies any headaches, hearing loss, fatigue, blurred vision, shortness of breath, chest pain, abdominal pain, problems with bowel movements, urination, or intercourse. No joint pain or mood swings. Has lost 20 lbs in last year or so.   Physical Exam:BP (!) 148/90 (BP Location: Left Arm, Patient Position: Sitting, Cuff Size: Normal)   Pulse 84   Ht 5' 1.5" (1.562 m)   Wt 177 lb (80.3 kg)   LMP 03/20/2017   BMI 32.90 kg/m  General:  Well developed, well nourished, no acute distress Skin:  Warm and dry Neck:  Midline trachea, normal thyroid, good ROM, no lymphadenopathy Lungs; Clear to auscultation bilaterally Breast:  No dominant palpable mass, retraction, or nipple discharge Cardiovascular: Regular rate and rhythm Abdomen:  Soft, non tender, no hepatosplenomegaly Pelvic:  External genitalia is normal in appearance, no lesions.  The vagina is normal in appearance. Urethra has no lesions or masses. The cervix is smooth, pap with HPV performed.  Uterus is felt to be normal size, shape, and contour.  No adnexal masses or tenderness noted.Bladder is non tender, no masses felt. Rectal: Good sphincter tone, no polyps, or hemorrhoids felt.  Hemoccult negative. Extremities/musculoskeletal:  No swelling or varicosities noted, no clubbing or cyanosis,pain in right shoulder at times Psych:  No mood changes, alert and cooperative,seems happy PHQ 2 score 0.  Impression: 1. Encounter for gynecological examination with Papanicolaou smear of cervix   2.  Screening for colorectal cancer   3. History of ovarian cyst   4.      Elevated LDL   Plan: Check lipids and CMP Mammogram yearly Physical in 1 year Pap in 3 if normal

## 2017-04-01 LAB — LIPID PANEL
CHOL/HDL RATIO: 3.3 ratio (ref 0.0–4.4)
Cholesterol, Total: 210 mg/dL — ABNORMAL HIGH (ref 100–199)
HDL: 63 mg/dL (ref >39)
LDL CALC: 121 mg/dL — AB (ref 0–99)
Triglycerides: 131 mg/dL (ref 0–149)
VLDL CHOLESTEROL CAL: 26 mg/dL (ref 5–40)

## 2017-04-01 LAB — COMPREHENSIVE METABOLIC PANEL
ALBUMIN: 4.1 g/dL (ref 3.5–5.5)
ALT: 10 IU/L (ref 0–32)
AST: 18 IU/L (ref 0–40)
Albumin/Globulin Ratio: 1.5 (ref 1.2–2.2)
Alkaline Phosphatase: 55 IU/L (ref 39–117)
BUN / CREAT RATIO: 13 (ref 9–23)
BUN: 8 mg/dL (ref 6–24)
Bilirubin Total: 0.4 mg/dL (ref 0.0–1.2)
CO2: 23 mmol/L (ref 20–29)
CREATININE: 0.64 mg/dL (ref 0.57–1.00)
Calcium: 9.2 mg/dL (ref 8.7–10.2)
Chloride: 100 mmol/L (ref 96–106)
GFR calc Af Amer: 123 mL/min/{1.73_m2} (ref >59)
GFR calc non Af Amer: 107 mL/min/{1.73_m2} (ref >59)
GLOBULIN, TOTAL: 2.7 g/dL (ref 1.5–4.5)
GLUCOSE: 89 mg/dL (ref 65–99)
Potassium: 5.1 mmol/L (ref 3.5–5.2)
SODIUM: 140 mmol/L (ref 134–144)
TOTAL PROTEIN: 6.8 g/dL (ref 6.0–8.5)

## 2017-04-01 LAB — CYTOLOGY - PAP
DIAGNOSIS: NEGATIVE
HPV: NOT DETECTED

## 2017-12-25 ENCOUNTER — Other Ambulatory Visit (HOSPITAL_COMMUNITY): Payer: Self-pay | Admitting: Internal Medicine

## 2017-12-25 DIAGNOSIS — Z1231 Encounter for screening mammogram for malignant neoplasm of breast: Secondary | ICD-10-CM

## 2018-04-06 ENCOUNTER — Ambulatory Visit (HOSPITAL_COMMUNITY)
Admission: RE | Admit: 2018-04-06 | Discharge: 2018-04-06 | Disposition: A | Discharge status: Home / Self Care | Payer: 59 | Source: Ambulatory Visit | Attending: Internal Medicine | Admitting: Internal Medicine

## 2018-04-06 ENCOUNTER — Other Ambulatory Visit: Payer: Self-pay | Admitting: Adult Health

## 2018-04-06 ENCOUNTER — Ambulatory Visit (INDEPENDENT_AMBULATORY_CARE_PROVIDER_SITE_OTHER): Payer: 59 | Admitting: Adult Health

## 2018-04-06 ENCOUNTER — Other Ambulatory Visit: Payer: Self-pay

## 2018-04-06 ENCOUNTER — Encounter: Payer: Self-pay | Admitting: Adult Health

## 2018-04-06 VITALS — BP 132/83 | HR 85 | Resp 18 | Ht 62.0 in | Wt 175.0 lb

## 2018-04-06 DIAGNOSIS — E7800 Pure hypercholesterolemia, unspecified: Secondary | ICD-10-CM | POA: Diagnosis not present

## 2018-04-06 DIAGNOSIS — Z1231 Encounter for screening mammogram for malignant neoplasm of breast: Secondary | ICD-10-CM | POA: Diagnosis not present

## 2018-04-06 DIAGNOSIS — Z01419 Encounter for gynecological examination (general) (routine) without abnormal findings: Secondary | ICD-10-CM | POA: Insufficient documentation

## 2018-04-06 DIAGNOSIS — Z1211 Encounter for screening for malignant neoplasm of colon: Secondary | ICD-10-CM | POA: Diagnosis not present

## 2018-04-06 DIAGNOSIS — Z131 Encounter for screening for diabetes mellitus: Secondary | ICD-10-CM | POA: Diagnosis not present

## 2018-04-06 DIAGNOSIS — Z1212 Encounter for screening for malignant neoplasm of rectum: Secondary | ICD-10-CM

## 2018-04-06 DIAGNOSIS — Z1321 Encounter for screening for nutritional disorder: Secondary | ICD-10-CM | POA: Diagnosis not present

## 2018-04-06 LAB — HEMOCCULT GUIAC POC 1CARD (OFFICE): Fecal Occult Blood, POC: NEGATIVE

## 2018-04-06 NOTE — Progress Notes (Signed)
Patient ID: Tammy Blankenship, female   DOB: [birth year], 48 y.o.   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  PCP for Dr Nevada Crane.    Current Medications, Allergies, Past Medical History, Past Surgical History, Family History and Social History were reviewed in [employer] record.     Review of Systems: Patient denies any headaches, hearing loss, fatigue, blurred vision, shortness of breath, chest pain, abdominal pain, problems with bowel movements, urination, or intercourse. No joint pain or mood swings. Periods regular, some cramps and PMS.    Physical Exam:BP 132/83 (BP Location: Right Arm, Patient Position: Sitting, Cuff Size: Normal)   Pulse 85   Resp 18   Ht 5\' 2"  (1.575 m)   Wt 175 lb (79.4 kg)   LMP 03/22/2018   BMI 32.01 kg/m  General:  Well developed, well nourished, no acute distress Skin:  Warm and dry Neck:  Midline trachea, normal thyroid, good ROM, no lymphadenopathy Lungs; Clear to auscultation bilaterally Breast:  No dominant palpable mass, retraction, or nipple discharge Cardiovascular: Regular rate and rhythm Abdomen:  Soft, non tender, no hepatosplenomegaly Pelvic:  External genitalia is normal in appearance, no lesions.  The vagina is normal in appearance. Urethra has no lesions or masses. The cervix is nulliparous.  Uterus is felt to be normal size, shape, and contour.  No adnexal masses or tenderness noted.Bladder is non tender, no masses felt. Rectal: Good sphincter tone, no polyps, + hemorrhoids felt.  Hemoccult negative. Extremities/musculoskeletal:  No swelling or varicosities noted, no clubbing or cyanosis Psych:  No mood changes, alert and cooperative,seems happy PHQ 2 score 0 Fall risk is low. Examination chaperoned by Shela Nevin RN.  Impression:  1. Encounter for well woman exam  with routine gynecological exam   2. Screening for colorectal cancer      Plan:  Check CBC,CMP,TSH and lipids,A!c and vitamin D Physical in 1 year Pap in 2022 Had mammogram this morning, and yearly Colonoscopy advised at 49 or could do colocguard

## 2018-04-07 ENCOUNTER — Encounter: Payer: Self-pay | Admitting: Adult Health

## 2018-04-07 DIAGNOSIS — E559 Vitamin D deficiency, unspecified: Secondary | ICD-10-CM

## 2018-04-07 DIAGNOSIS — R7989 Other specified abnormal findings of blood chemistry: Secondary | ICD-10-CM | POA: Insufficient documentation

## 2018-04-07 HISTORY — DX: Vitamin D deficiency, unspecified: E55.9

## 2018-04-07 LAB — COMPREHENSIVE METABOLIC PANEL
ALBUMIN: 4.3 g/dL (ref 3.8–4.8)
ALT: 8 IU/L (ref 0–32)
AST: 17 IU/L (ref 0–40)
Albumin/Globulin Ratio: 2 (ref 1.2–2.2)
Alkaline Phosphatase: 54 IU/L (ref 39–117)
BUN/Creatinine Ratio: 10 (ref 9–23)
BUN: 7 mg/dL (ref 6–24)
Bilirubin Total: 0.3 mg/dL (ref 0.0–1.2)
CHLORIDE: 103 mmol/L (ref 96–106)
CO2: 23 mmol/L (ref 20–29)
Calcium: 9.4 mg/dL (ref 8.7–10.2)
Creatinine, Ser: 0.7 mg/dL (ref 0.57–1.00)
GFR calc Af Amer: 118 mL/min/{1.73_m2} (ref >59)
GFR calc non Af Amer: 103 mL/min/{1.73_m2} (ref >59)
GLUCOSE: 86 mg/dL (ref 65–99)
Globulin, Total: 2.2 g/dL (ref 1.5–4.5)
Potassium: 5.1 mmol/L (ref 3.5–5.2)
Sodium: 140 mmol/L (ref 134–144)
Total Protein: 6.5 g/dL (ref 6.0–8.5)

## 2018-04-07 LAB — CBC
Hematocrit: 42.6 % (ref 34.0–46.6)
Hemoglobin: 14.6 g/dL (ref 11.1–15.9)
MCH: 29.7 pg (ref 26.6–33.0)
MCHC: 34.3 g/dL (ref 31.5–35.7)
MCV: 87 fL (ref 79–97)
Platelets: 285 10*3/uL (ref 150–450)
RBC: 4.91 x10E6/uL (ref 3.77–5.28)
RDW: 12.3 % (ref 11.7–15.4)
WBC: 9.7 10*3/uL (ref 3.4–10.8)

## 2018-04-07 LAB — LIPID PANEL
Chol/HDL Ratio: 3 ratio (ref 0.0–4.4)
Cholesterol, Total: 196 mg/dL (ref 100–199)
HDL: 66 mg/dL (ref >39)
LDL Calculated: 103 mg/dL — ABNORMAL HIGH (ref 0–99)
TRIGLYCERIDES: 133 mg/dL (ref 0–149)
VLDL Cholesterol Cal: 27 mg/dL (ref 5–40)

## 2018-04-07 LAB — TSH: TSH: 4.69 u[IU]/mL — ABNORMAL HIGH (ref 0.450–4.500)

## 2018-04-07 LAB — VITAMIN D 25 HYDROXY (VIT D DEFICIENCY, FRACTURES): Vit D, 25-Hydroxy: 19.3 ng/mL — ABNORMAL LOW (ref 30.0–100.0)

## 2018-04-07 LAB — HEMOGLOBIN A1C
Est. average glucose Bld gHb Est-mCnc: 103 mg/dL
Hgb A1c MFr Bld: 5.2 % (ref 4.8–5.6)

## 2019-07-19 ENCOUNTER — Telehealth: Payer: Self-pay | Admitting: Adult Health

## 2019-07-19 ENCOUNTER — Other Ambulatory Visit: Payer: Self-pay | Admitting: Adult Health

## 2019-07-19 DIAGNOSIS — Z131 Encounter for screening for diabetes mellitus: Secondary | ICD-10-CM

## 2019-07-19 DIAGNOSIS — Z01419 Encounter for gynecological examination (general) (routine) without abnormal findings: Secondary | ICD-10-CM

## 2019-07-19 DIAGNOSIS — R7989 Other specified abnormal findings of blood chemistry: Secondary | ICD-10-CM

## 2019-07-19 NOTE — Telephone Encounter (Signed)
Pt would like to see if her lab orders can be put in so that she have the labs drawn before her appt on the 10th. Please advise pt.

## 2019-07-19 NOTE — Telephone Encounter (Signed)
Pt aware that I placed labs orders can go anytime, and she mentioned she is having palpitations, no nausea, sweating, chest pain or shortness of breath, feels tired,but stopped caffeine and did not get repeat TSH, if has any of the above symptoms, go to ER, but keep appt 5/10, can get cardiology consult and maybe event monitor, ,will call with cancellations

## 2019-07-19 NOTE — Progress Notes (Signed)
Lab orders in Check CBC,CMP,TSH and lipids, A1c and vitamin D

## 2019-07-21 ENCOUNTER — Telehealth: Payer: Self-pay | Admitting: Adult Health

## 2019-07-21 ENCOUNTER — Ambulatory Visit (INDEPENDENT_AMBULATORY_CARE_PROVIDER_SITE_OTHER): Payer: 59 | Admitting: Adult Health

## 2019-07-21 ENCOUNTER — Encounter: Payer: Self-pay | Admitting: Adult Health

## 2019-07-21 ENCOUNTER — Other Ambulatory Visit: Payer: Self-pay

## 2019-07-21 VITALS — BP 150/80 | HR 122 | Ht 62.0 in | Wt 177.0 lb

## 2019-07-21 DIAGNOSIS — R030 Elevated blood-pressure reading, without diagnosis of hypertension: Secondary | ICD-10-CM

## 2019-07-21 DIAGNOSIS — Z01419 Encounter for gynecological examination (general) (routine) without abnormal findings: Secondary | ICD-10-CM | POA: Diagnosis not present

## 2019-07-21 DIAGNOSIS — R7989 Other specified abnormal findings of blood chemistry: Secondary | ICD-10-CM | POA: Diagnosis not present

## 2019-07-21 DIAGNOSIS — Z1212 Encounter for screening for malignant neoplasm of rectum: Secondary | ICD-10-CM | POA: Diagnosis not present

## 2019-07-21 DIAGNOSIS — Z1211 Encounter for screening for malignant neoplasm of colon: Secondary | ICD-10-CM

## 2019-07-21 DIAGNOSIS — R1031 Right lower quadrant pain: Secondary | ICD-10-CM | POA: Diagnosis not present

## 2019-07-21 DIAGNOSIS — N951 Menopausal and female climacteric states: Secondary | ICD-10-CM | POA: Diagnosis not present

## 2019-07-21 DIAGNOSIS — R002 Palpitations: Secondary | ICD-10-CM

## 2019-07-21 DIAGNOSIS — Z131 Encounter for screening for diabetes mellitus: Secondary | ICD-10-CM | POA: Diagnosis not present

## 2019-07-21 LAB — HEMOCCULT GUIAC POC 1CARD (OFFICE): Fecal Occult Blood, POC: NEGATIVE

## 2019-07-21 NOTE — Patient Instructions (Signed)
Menopause Menopause is the normal time of life when menstrual periods stop completely. It is usually confirmed by 12 months without a menstrual period. The transition to menopause (perimenopause) most often happens between the ages of 45 and 55. During perimenopause, hormone levels change in your body, which can cause symptoms and affect your health. Menopause may increase your risk for:  Loss of bone (osteoporosis), which causes bone breaks (fractures).  Depression.  Hardening and narrowing of the arteries (atherosclerosis), which can cause heart attacks and strokes. What are the causes? This condition is usually caused by a natural change in hormone levels that happens as you get older. The condition may also be caused by surgery to remove both ovaries (bilateral oophorectomy). What increases the risk? This condition is more likely to start at an earlier age if you have certain medical conditions or treatments, including:  A tumor of the pituitary gland in the brain.  A disease that affects the ovaries and hormone production.  Radiation treatment for cancer.  Certain cancer treatments, such as chemotherapy or hormone (anti-estrogen) therapy.  Heavy smoking and excessive alcohol use.  Family history of early menopause. This condition is also more likely to develop earlier in women who are very thin. What are the signs or symptoms? Symptoms of this condition include:  Hot flashes.  Irregular menstrual periods.  Night sweats.  Changes in feelings about sex. This could be a decrease in sex drive or an increased comfort around your sexuality.  Vaginal dryness and thinning of the vaginal walls. This may cause painful intercourse.  Dryness of the skin and development of wrinkles.  Headaches.  Problems sleeping (insomnia).  Mood swings or irritability.  Memory problems.  Weight gain.  Hair growth on the face and chest.  Bladder infections or problems with urinating. How  is this diagnosed? This condition is diagnosed based on your medical history, a physical exam, your age, your menstrual history, and your symptoms. Hormone tests may also be done. How is this treated? In some cases, no treatment is needed. You and your health care provider should make a decision together about whether treatment is necessary. Treatment will be based on your individual condition and preferences. Treatment for this condition focuses on managing symptoms. Treatment may include:  Menopausal hormone therapy (MHT).  Medicines to treat specific symptoms or complications.  Acupuncture.  Vitamin or herbal supplements. Before starting treatment, make sure to let your health care provider know if you have a personal or family history of:  Heart disease.  Breast cancer.  Blood clots.  Diabetes.  Osteoporosis. Follow these instructions at home: Lifestyle  Do not use any products that contain nicotine or tobacco, such as cigarettes and e-cigarettes. If you need help quitting, ask your health care provider.  Get at least 30 minutes of physical activity on 5 or more days each week.  Avoid alcoholic and caffeinated beverages, as well as spicy foods. This may help prevent hot flashes.  Get 7-8 hours of sleep each night.  If you have hot flashes, try: ? Dressing in layers. ? Avoiding things that may trigger hot flashes, such as spicy food, warm places, or stress. ? Taking slow, deep breaths when a hot flash starts. ? Keeping a fan in your home and office.  Find ways to manage stress, such as deep breathing, meditation, or journaling.  Consider going to group therapy with other women who are having menopause symptoms. Ask your health care provider about recommended group therapy meetings. Eating and   drinking  Eat a healthy, balanced diet that contains whole grains, lean protein, low-fat dairy, and plenty of fruits and vegetables.  Your health care provider may recommend  adding more soy to your diet. Foods that contain soy include tofu, tempeh, and soy milk.  Eat plenty of foods that contain calcium and vitamin D for bone health. Items that are rich in calcium include low-fat milk, yogurt, beans, almonds, sardines, broccoli, and kale. Medicines  Take over-the-counter and prescription medicines only as told by your health care provider.  Talk with your health care provider before starting any herbal supplements. If prescribed, take vitamins and supplements as told by your health care provider. These may include: ? Calcium. Women age 51 and older should get 1,200 mg (milligrams) of calcium every day. ? Vitamin D. Women need 600-800 International Units of vitamin D each day. ? Vitamins B12 and B6. Aim for 50 micrograms of B12 and 1.5 mg of B6 each day. General instructions  Keep track of your menstrual periods, including: ? When they occur. ? How heavy they are and how long they last. ? How much time passes between periods.  Keep track of your symptoms, noting when they start, how often you have them, and how long they last.  Use vaginal lubricants or moisturizers to help with vaginal dryness and improve comfort during sex.  Keep all follow-up visits as told by your health care provider. This is important. This includes any group therapy or counseling. Contact a health care provider if:  You are still having menstrual periods after age 55.  You have pain during sex.  You have not had a period for 12 months and you develop vaginal bleeding. Get help right away if:  You have: ? Severe depression. ? Excessive vaginal bleeding. ? Pain when you urinate. ? A fast or irregular heart beat (palpitations). ? Severe headaches. ? Abdomen (abdominal) pain or severe indigestion.  You fell and you think you have a broken bone.  You develop leg or chest pain.  You develop vision problems.  You feel a lump in your breast. Summary  Menopause is the normal  time of life when menstrual periods stop completely. It is usually confirmed by 12 months without a menstrual period.  The transition to menopause (perimenopause) most often happens between the ages of 45 and 55.  Symptoms can be managed through medicines, lifestyle changes, and complementary therapies such as acupuncture.  Eat a balanced diet that is rich in nutrients to promote bone health and heart health and to manage symptoms during menopause. This information is not intended to replace advice given to you by your health care provider. Make sure you discuss any questions you have with your health care provider. Document Revised: 02/14/2017 Document Reviewed: 04/06/2016 Elsevier Patient Education  2020 Elsevier Inc.  

## 2019-07-21 NOTE — Addendum Note (Signed)
Addended by: Derrek Monaco A on: 07/21/2019 04:50 PM   Modules accepted: Orders

## 2019-07-21 NOTE — Telephone Encounter (Signed)
Let her know sent referral to [facility] to see if they could see sooner, she is nervous

## 2019-07-21 NOTE — Telephone Encounter (Signed)
Pt would like to see if she can get a referral to the [facility] in [place] in hopes to get an earlier appt. The one here and in [place] cannot see her until 5/20.

## 2019-07-21 NOTE — Progress Notes (Signed)
Patient ID: Tammy Blankenship, female   DOB: [birth year], 49 y.o.   MRN: [id number] History of Present Illness:  Esi is a 49 year old white female,single, G0P0, in for a well woman gyn exam, she had a normal pap with negative HPV 04/01/19. 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 She is complaining of palpitations, RLQ pain and periods heavier, easily teary and wakes up at 3 am most nights. She had fasting labs this am.  PCP is Dr Nevada Crane.   Current Medications, Allergies, Past Medical History, Past Surgical History, Family History and Social History were reviewed in [employer] record.     Review of Systems: Patient denies any headaches, hearing loss, fatigue, blurred vision, shortness of breath, chest pain, abdominal pain, problems with bowel movements, urination, or intercourse. No joint pain or mood swings. See HPI for positives.    Physical Exam:BP (!) 150/80 (BP Location: Left Arm, Cuff Size: Normal)   Pulse (!) 122   Ht 5\' 2"  (1.575 m)   Wt 177 lb (80.3 kg)   LMP 06/30/2019 (Exact Date)   BMI 32.37 kg/m  General:  Well developed, well nourished, no acute distress Skin:  Warm and dry Neck:  Midline trachea, normal thyroid, good ROM, no lymphadenopathy Lungs; Clear to auscultation bilaterally Breast:  No dominant palpable mass, retraction, or nipple discharge Cardiovascular: Regular rate and rhythm Abdomen:  Soft, non tender, no hepatosplenomegaly Pelvic:  External genitalia is normal in appearance, no lesions.  The vagina is normal in appearance. Urethra has no lesions or masses. The cervix is smooth.  Uterus is felt to be normal size, shape, and contour.  No adnexal masses or tenderness noted.Bladder is non tender, no masses felt. Rectal: Good sphincter tone, no polyps, or hemorrhoids felt.  Hemoccult negative. Extremities/musculoskeletal:  No swelling or varicosities noted, no clubbing or cyanosis Psych:  No mood changes, alert and cooperative,seems happy AA 2 PHQ 9  score is 4, no SI Examination chaperoned by Rolena Infante LPN  Impression and Plan: 1. Encounter for well woman exam with routine gynecological exam Physical and pap in 1 year Mammogram yearly Will talk when labs back   2. Screening for colorectal cancer -colonoscopy at 75  3. Peri-menopause Discussed in detail, review handout on menopause   4. Heart palpitations Refer to cardiologist, may need event monitor  Discussed if any pain, sweating just not feeling good to go to ER  5. RLQ abdominal pain Return in 1 week for GYN [place]  6. Elevated BP without diagnosis of hypertension Keep check on BP at work and let me know results

## 2019-07-22 ENCOUNTER — Telehealth: Payer: Self-pay | Admitting: Adult Health

## 2019-07-22 ENCOUNTER — Ambulatory Visit: Payer: 59 | Admitting: Adult Health

## 2019-07-22 LAB — COMPREHENSIVE METABOLIC PANEL
ALT: 7 IU/L (ref 0–32)
AST: 17 IU/L (ref 0–40)
Albumin/Globulin Ratio: 1.8 (ref 1.2–2.2)
Albumin: 4.2 g/dL (ref 3.8–4.8)
Alkaline Phosphatase: 58 IU/L (ref 39–117)
BUN/Creatinine Ratio: 15 (ref 9–23)
BUN: 11 mg/dL (ref 6–24)
Bilirubin Total: 0.5 mg/dL (ref 0.0–1.2)
CO2: 16 mmol/L — ABNORMAL LOW (ref 20–29)
Calcium: 9.1 mg/dL (ref 8.7–10.2)
Chloride: 104 mmol/L (ref 96–106)
Creatinine, Ser: 0.73 mg/dL (ref 0.57–1.00)
GFR calc Af Amer: 112 mL/min/{1.73_m2} (ref >59)
GFR calc non Af Amer: 97 mL/min/{1.73_m2} (ref >59)
Globulin, Total: 2.4 g/dL (ref 1.5–4.5)
Glucose: 78 mg/dL (ref 65–99)
Potassium: 5.4 mmol/L — ABNORMAL HIGH (ref 3.5–5.2)
Sodium: 137 mmol/L (ref 134–144)
Total Protein: 6.6 g/dL (ref 6.0–8.5)

## 2019-07-22 LAB — CBC
Hematocrit: 43.9 % (ref 34.0–46.6)
Hemoglobin: 14.4 g/dL (ref 11.1–15.9)
MCH: 29 pg (ref 26.6–33.0)
MCHC: 32.8 g/dL (ref 31.5–35.7)
MCV: 89 fL (ref 79–97)
Platelets: 261 10*3/uL (ref 150–450)
RBC: 4.96 x10E6/uL (ref 3.77–5.28)
RDW: 12.6 % (ref 11.7–15.4)
WBC: 11.5 10*3/uL — ABNORMAL HIGH (ref 3.4–10.8)

## 2019-07-22 LAB — VITAMIN D 25 HYDROXY (VIT D DEFICIENCY, FRACTURES): Vit D, 25-Hydroxy: 19.9 ng/mL — ABNORMAL LOW (ref 30.0–100.0)

## 2019-07-22 LAB — LIPID PANEL
Chol/HDL Ratio: 3.3 ratio (ref 0.0–4.4)
Cholesterol, Total: 193 mg/dL (ref 100–199)
HDL: 58 mg/dL (ref >39)
LDL Chol Calc (NIH): 117 mg/dL — ABNORMAL HIGH (ref 0–99)
Triglycerides: 102 mg/dL (ref 0–149)
VLDL Cholesterol Cal: 18 mg/dL (ref 5–40)

## 2019-07-22 LAB — HEMOGLOBIN A1C
Est. average glucose Bld gHb Est-mCnc: 103 mg/dL
Hgb A1c MFr Bld: 5.2 % (ref 4.8–5.6)

## 2019-07-22 LAB — TSH: TSH: 2.7 u[IU]/mL (ref 0.450–4.500)

## 2019-07-22 MED ORDER — BUSPIRONE HCL 5 MG PO TABS: 5.0000 mg | ORAL_TABLET | Freq: Three times a day (TID) | ORAL | 2 refills | Status: DC

## 2019-07-22 NOTE — Telephone Encounter (Signed)
Pt called and stated she would like to try the Buspar. Pt uses [facility]. Please call pt to let her know once medication has been sent in.

## 2019-07-22 NOTE — Telephone Encounter (Signed)
Pt aware that Buspar sent and aware of labs take vitamin D as it is low, and decrease K+ containing foods for a few days, as K+ 5.4

## 2019-07-22 NOTE — Addendum Note (Signed)
Addended by: Derrek Monaco A on: 07/22/2019 09:04 AM   Modules accepted: Orders

## 2019-07-26 ENCOUNTER — Other Ambulatory Visit: Payer: 59 | Admitting: Adult Health

## 2019-07-29 ENCOUNTER — Other Ambulatory Visit: Payer: Self-pay

## 2019-07-29 ENCOUNTER — Ambulatory Visit (INDEPENDENT_AMBULATORY_CARE_PROVIDER_SITE_OTHER): Payer: 59

## 2019-07-29 DIAGNOSIS — N858 Other specified noninflammatory disorders of uterus: Secondary | ICD-10-CM | POA: Diagnosis not present

## 2019-07-29 DIAGNOSIS — D259 Leiomyoma of uterus, unspecified: Secondary | ICD-10-CM

## 2019-07-29 DIAGNOSIS — R1031 Right lower quadrant pain: Secondary | ICD-10-CM

## 2019-07-29 NOTE — Progress Notes (Signed)
PELVIC US TA/TV:heterogeneous anteverted uterus with mult fibroids (#1) mid right intramural fibroid 5.4 x 4.6 x 3.4 cm,(#2) fundal subserosal fibroid 2.2 x 1.1 x 2.4 cm, EEC 6.5 mm,mult nabothian cysts,normal ovaries,ovaries appear mobile,no pain during ultrasound  Chaperone Peggy

## 2019-08-05 ENCOUNTER — Ambulatory Visit: Payer: 59 | Admitting: Cardiology

## 2019-08-05 ENCOUNTER — Ambulatory Visit (INDEPENDENT_AMBULATORY_CARE_PROVIDER_SITE_OTHER): Payer: 59

## 2019-08-05 ENCOUNTER — Encounter: Payer: Self-pay | Admitting: Cardiology

## 2019-08-05 ENCOUNTER — Other Ambulatory Visit: Payer: Self-pay

## 2019-08-05 ENCOUNTER — Telehealth: Payer: Self-pay | Admitting: Cardiology

## 2019-08-05 VITALS — BP 120/98 | HR 110 | Ht 62.0 in | Wt 172.0 lb

## 2019-08-05 DIAGNOSIS — R002 Palpitations: Secondary | ICD-10-CM | POA: Diagnosis not present

## 2019-08-05 NOTE — Telephone Encounter (Signed)
Pre-cert Verification for the following procedure    7 day ZIO AT

## 2019-08-05 NOTE — Progress Notes (Signed)
Cardiology Office Note  Date: 08/05/2019   ID: Tammy Blankenship, DOB June 16, 1970, MRN [id number]  PCP:  Celene Squibb, MD  Cardiologist:  Rozann Lesches, MD Electrophysiologist:  None   Chief Complaint  Patient presents with  . Palpitations    History of Present Illness: Tammy Blankenship is a 49 y.o. female referred for cardiology consultation by Ms. Tammy Montana NP for the evaluation of palpitations.  She states that over the years she has experienced occasional brief fluttering sensations that have not been overly bothersome to her.  As of April 27 she states that she has felt more regular fluttering in her chest, a "thud" feeling occasionally.  She cannot identify any specific trigger for symptoms, these tend to be more present in the afternoons, do not necessarily limit her activity.  She does not report exertional chest pain with the symptoms and has had no sudden unexplained syncope.  Since the beginning of May she has made several lifestyle changes, exercising regularly, focusing on a Mediterranean style diet and has lost about 5 pounds, also cut back caffeine significantly.  She feels like the fluttering symptoms have gotten much better although have not completely resolved.  She also mentions that she has recently started on BuSpar for treatment of post menopausal symptoms and does feel better.  I personally reviewed her ECG today which shows normal sinus rhythm, borderline low voltage in the precordial leads, normal intervals.  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  Past Medical History:  Diagnosis Date  . Fibroid 09/22/2015  . History of ovarian cyst 09/15/2015  . Hypothyroidism   . PCO (polycystic ovaries)   . Vitamin D deficiency 04/07/2018   Take 5000 IU daily    Past Surgical History:  Procedure Laterality Date  . FLEXIBLE SIGMOIDOSCOPY  12/14/2010   Procedure: FLEXIBLE SIGMOIDOSCOPY;  Surgeon: Dorothyann Peng, MD;  Location: [facility];  Service: Endoscopy;   Laterality: N/A;  8:30  . TONSILLECTOMY      Current Outpatient Medications  Medication Sig Dispense Refill  . busPIRone (BUSPAR) 5 MG tablet Take 1 tablet (5 mg total) by mouth 3 (three) times daily. (Patient taking differently: Take 5 mg by mouth 2 (two) times daily. ) 90 tablet 2  . VITAMIN D PO Take 800 Units by mouth daily.     No current facility-administered medications for this visit.   Allergies:  Patient has no known allergies.   Social History: The patient  reports that she has quit smoking. Her smoking use included cigarettes. She has never used smokeless tobacco. She reports current alcohol use. She reports that she does not use drugs.   Family History: The patient's family history includes Atrial fibrillation in her mother; Breast cancer (age of onset: 14) in her mother; Colon cancer in her maternal uncle; Dementia in her father; Diabetes in her sister; Parkinson's disease in her father; Rectal cancer in her maternal uncle.   ROS:   No orthopnea or PND, no leg swelling.  Physical Exam: VS:  BP (!) 120/98   Pulse (!) 110   Ht 5\' 2"  (1.575 m)   Wt 172 lb (78 kg)   SpO2 98%   BMI 31.46 kg/m , BMI Body mass index is 31.46 kg/m.  Wt Readings from Last 3 Encounters:  08/05/19 172 lb (78 kg)  07/21/19 177 lb (80.3 kg)  04/06/18 175 lb (79.4 kg)    General: Patient appears comfortable at rest. HEENT: Conjunctiva and lids normal, wearing  a mask. Neck: Supple, no elevated JVP or carotid bruits, no thyromegaly. Lungs: Clear to auscultation, nonlabored breathing at rest. Cardiac: Regular rate and rhythm, no S3 or significant systolic murmur, no pericardial rub.  Normal respiratory variation in heart rate. Extremities: No pitting edema, distal pulses 2+.  ECG:  There are no old tracings for review today.  Recent Labwork: 07/21/2019: ALT 7; AST 17; BUN 11; Creatinine, Ser 0.73; Hemoglobin 14.4; Platelets 261; Potassium 5.4; Sodium 137; TSH 2.700     Component Value  Date/Time   CHOL 193 07/21/2019 0836   TRIG 102 07/21/2019 0836   HDL 58 07/21/2019 0836   CHOLHDL 3.3 07/21/2019 0836   LDLCALC 117 (H) 07/21/2019 0836    Other Studies Reviewed Today:  No prior cardiac testing for review today.  Assessment and Plan:  Sense of palpitations as outlined above, symptoms have improved but not completely resolved following lifestyle changes since early May including increase in exercise, Mediterranean style diet, and also reduction in caffeine use.  She is also now on BuSpar for treatment of postmenopausal symptoms.  Her ECG shows sinus rhythm with normal intervals.  Heart rate was elevated when she first came in today but did settle down and she has appropriate respiratory variation on examination without persistent tachycardia.  We will plan a 7-day Zio patch for further investigation of any potential arrhythmias, she could be sensing occasional ectopic atrial or ventricular beats based on description.  Medication Adjustments/Labs and Tests Ordered: Current medicines are reviewed at length with the patient today.  Concerns regarding medicines are outlined above.   Tests Ordered: Orders Placed This Encounter  Procedures  . LONG TERM MONITOR (3-14 DAYS)  . EKG 12-Lead    Medication Changes: No orders of the defined types were placed in this encounter.   Disposition:  Follow up test results.  Signed, Satira Sark, MD, [facility] 08/05/2019 11:05 AM    [facility] [address] [place], [place], [place] [address] Phone: [phone]; Fax: [phone]

## 2019-08-05 NOTE — Patient Instructions (Addendum)
Medication Instructions:   Your physician recommends that you continue on your current medications as directed. Please refer to the Current Medication list given to you today.  Labwork:  NONE  Testing/Procedures: Your physician has recommended that you wear an event monitor for 7 days. Event monitors are medical devices that record the heart's electrical activity. Doctors most often [place] these monitors to diagnose arrhythmias. Arrhythmias are problems with the speed or rhythm of the heartbeat. The monitor is a small, portable device. You can wear one while you do your normal daily activities. This is usually used to diagnose what is causing palpitations/syncope (passing out). [employer] will contact you about this monitor.  Follow-Up:  Your physician recommends that you schedule a follow-up appointment in: pending.  Any Other Special Instructions Will Be Listed Below (If Applicable).  If you need a refill on your cardiac medications before your next appointment, please call your pharmacy.

## 2019-08-06 DIAGNOSIS — R002 Palpitations: Secondary | ICD-10-CM | POA: Diagnosis not present

## 2019-08-23 ENCOUNTER — Telehealth: Payer: Self-pay | Admitting: Cardiology

## 2019-08-23 ENCOUNTER — Telehealth: Payer: Self-pay | Admitting: Adult Health

## 2019-08-23 ENCOUNTER — Telehealth: Payer: Self-pay | Admitting: *Deleted

## 2019-08-23 NOTE — Telephone Encounter (Signed)
-----   Message from Satira Sark, MD sent at 08/23/2019  9:54 AM EDT ----- Results reviewed.  Please let her know that the cardiac monitor was reassuring overall.  She is in normal rhythm throughout, does have brief ectopic beats from the atria and the ventricle, also 2 very brief episodes of SVT.  She could be feeling intermittent palpitations related to this, but these are not dangerous to her and we do not necessarily need to start any medications.

## 2019-08-23 NOTE — Telephone Encounter (Signed)
Pt requesting provider [place] to review ultrasound results with patient

## 2019-08-23 NOTE — Telephone Encounter (Signed)
Left message that US showed fibroids, ovaries are normal, call me back to discuss more

## 2019-08-23 NOTE — Telephone Encounter (Signed)
Patient called requesting to see if we have results back from monitor.

## 2019-08-23 NOTE — Telephone Encounter (Signed)
Patient called office stating that she was concerned why there was a dx attached to her labs from 07/21/19 of abnormal findings and would like a follow up call regarding her dx from [facility] 07/21/19

## 2019-08-23 NOTE — Telephone Encounter (Signed)
Patient informed. Copy sent to PCP °

## 2019-08-23 NOTE — Telephone Encounter (Signed)
Patient states she has received a bill from [employer].  She has spoken with them along with [facility] and was informed it was due to a code attached to one of the orders.  Informed I would check with someone in our office to see how this can be handled and will get back to her.  Pt verbalized understanding.

## 2019-08-25 ENCOUNTER — Telehealth: Payer: Self-pay | Admitting: *Deleted

## 2019-08-25 NOTE — Telephone Encounter (Signed)
LMOVM that I have passed this on to [employer] who is going to contact [place].

## 2019-10-12 ENCOUNTER — Other Ambulatory Visit (HOSPITAL_COMMUNITY): Payer: Self-pay | Admitting: Internal Medicine

## 2019-10-12 ENCOUNTER — Other Ambulatory Visit (HOSPITAL_COMMUNITY): Payer: Self-pay | Admitting: Adult Health

## 2019-10-12 DIAGNOSIS — Z1231 Encounter for screening mammogram for malignant neoplasm of breast: Secondary | ICD-10-CM

## 2019-10-14 ENCOUNTER — Other Ambulatory Visit: Payer: Self-pay | Admitting: Adult Health

## 2019-10-18 ENCOUNTER — Other Ambulatory Visit: Payer: Self-pay

## 2019-10-18 ENCOUNTER — Ambulatory Visit (HOSPITAL_COMMUNITY)
Admission: RE | Admit: 2019-10-18 | Discharge: 2019-10-18 | Disposition: A | Discharge status: Home / Self Care | Payer: 59 | Source: Ambulatory Visit | Attending: Adult Health | Admitting: Adult Health

## 2019-10-18 DIAGNOSIS — Z1231 Encounter for screening mammogram for malignant neoplasm of breast: Secondary | ICD-10-CM | POA: Insufficient documentation

## 2019-12-27 ENCOUNTER — Encounter: Payer: Self-pay | Admitting: Internal Medicine

## 2020-01-12 ENCOUNTER — Other Ambulatory Visit: Payer: Self-pay | Admitting: Adult Health

## 2020-01-12 ENCOUNTER — Telehealth: Payer: Self-pay | Admitting: Adult Health

## 2020-01-12 MED ORDER — BUSPIRONE HCL 5 MG PO TABS: 5.0000 mg | ORAL_TABLET | Freq: Two times a day (BID) | ORAL | 3 refills | Status: DC

## 2020-01-12 MED FILL — busPIRone HCL 5 MG TABS: 5 | 30 days supply | Qty: 60 | Fill #0

## 2020-01-12 NOTE — Addendum Note (Signed)
Addended by: Derrek Monaco A on: 01/12/2020 05:12 PM   Modules accepted: Orders

## 2020-01-12 NOTE — Telephone Encounter (Signed)
Refilled buspar to [place]

## 2020-01-12 NOTE — Telephone Encounter (Signed)
Pt would like to change her preferred pharmacy to [facility]  Needs refill on Buspar early November   Please advise & call pt

## 2020-02-15 MED FILL — busPIRone HCL 5 MG TABS: 5 | 30 days supply | Qty: 60 | Fill #1

## 2020-02-17 ENCOUNTER — Ambulatory Visit: Payer: 59 | Attending: Internal Medicine

## 2020-02-17 DIAGNOSIS — Z23 Encounter for immunization: Secondary | ICD-10-CM

## 2020-02-17 NOTE — Progress Notes (Signed)
   Covid-19 Vaccination Clinic  Name:  JAICE LAGUE    MRN: [id number] DOB: 02-14-1971  02/17/2020  Ms. Mamone was observed post Covid-19 immunization for 15 minutes without incident. She was provided with Vaccine Information Sheet and instruction to access the V-Safe system.   Ms. Dowd was instructed to call 911 with any severe reactions post vaccine: Marland Kitchen Difficulty breathing  . Swelling of face and throat  . A fast heartbeat  . A bad rash all over body  . Dizziness and weakness   Immunizations Administered    No immunizations on file.

## 2020-03-13 MED FILL — busPIRone HCL 5 MG TABS: 5 | 30 days supply | Qty: 60 | Fill #2

## 2020-04-12 MED FILL — busPIRone HCL 5 MG TABS: 5 | 30 days supply | Qty: 60 | Fill #3

## 2020-05-16 ENCOUNTER — Other Ambulatory Visit: Payer: Self-pay | Admitting: Adult Health

## 2020-05-16 MED FILL — busPIRone HCL 5 MG TABS: 5 | 30 days supply | Qty: 60 | Fill #0

## 2020-06-19 ENCOUNTER — Other Ambulatory Visit (HOSPITAL_BASED_OUTPATIENT_CLINIC_OR_DEPARTMENT_OTHER): Payer: Self-pay

## 2020-06-19 MED FILL — Buspirone HCl Tab 5 MG: ORAL | 30 days supply | Qty: 60 | Fill #0 | Status: AC

## 2020-07-20 ENCOUNTER — Other Ambulatory Visit (HOSPITAL_COMMUNITY): Payer: Self-pay

## 2020-07-20 MED FILL — Buspirone HCl Tab 5 MG: ORAL | 30 days supply | Qty: 60 | Fill #1 | Status: AC

## 2020-07-21 ENCOUNTER — Other Ambulatory Visit (HOSPITAL_COMMUNITY): Payer: Self-pay | Admitting: Internal Medicine

## 2020-07-21 DIAGNOSIS — Z1231 Encounter for screening mammogram for malignant neoplasm of breast: Secondary | ICD-10-CM

## 2020-08-15 ENCOUNTER — Other Ambulatory Visit (HOSPITAL_COMMUNITY): Payer: Self-pay

## 2020-08-15 MED FILL — Buspirone HCl Tab 5 MG: ORAL | 30 days supply | Qty: 60 | Fill #2 | Status: AC

## 2020-09-14 ENCOUNTER — Ambulatory Visit (INDEPENDENT_AMBULATORY_CARE_PROVIDER_SITE_OTHER): Payer: 59 | Admitting: Adult Health

## 2020-09-14 ENCOUNTER — Other Ambulatory Visit: Payer: Self-pay

## 2020-09-14 ENCOUNTER — Other Ambulatory Visit (HOSPITAL_COMMUNITY)
Admission: RE | Admit: 2020-09-14 | Discharge: 2020-09-14 | Disposition: A | Discharge status: Home / Self Care | Payer: 59 | Source: Ambulatory Visit | Attending: Adult Health | Admitting: Adult Health

## 2020-09-14 ENCOUNTER — Encounter: Payer: Self-pay | Admitting: Adult Health

## 2020-09-14 ENCOUNTER — Other Ambulatory Visit (HOSPITAL_COMMUNITY): Payer: Self-pay

## 2020-09-14 VITALS — BP 130/90 | HR 76 | Ht 62.0 in | Wt 177.2 lb

## 2020-09-14 DIAGNOSIS — F419 Anxiety disorder, unspecified: Secondary | ICD-10-CM | POA: Diagnosis not present

## 2020-09-14 DIAGNOSIS — Z1211 Encounter for screening for malignant neoplasm of colon: Secondary | ICD-10-CM | POA: Diagnosis not present

## 2020-09-14 DIAGNOSIS — R7989 Other specified abnormal findings of blood chemistry: Secondary | ICD-10-CM | POA: Insufficient documentation

## 2020-09-14 DIAGNOSIS — Z01419 Encounter for gynecological examination (general) (routine) without abnormal findings: Secondary | ICD-10-CM | POA: Insufficient documentation

## 2020-09-14 DIAGNOSIS — K649 Unspecified hemorrhoids: Secondary | ICD-10-CM | POA: Diagnosis not present

## 2020-09-14 LAB — HEMOCCULT GUIAC POC 1CARD (OFFICE): Fecal Occult Blood, POC: NEGATIVE

## 2020-09-14 MED ORDER — BUSPIRONE HCL 5 MG PO TABS
ORAL_TABLET | Freq: Two times a day (BID) | ORAL | 4 refills | Status: DC
  Filled 2020-09-14: qty 180, 90d supply, fill #0
  Filled 2020-12-19 – 2020-12-30 (×2): qty 180, 90d supply, fill #1
  Filled 2021-03-19: qty 180, 90d supply, fill #2
  Filled 2021-07-10: qty 180, 90d supply, fill #3

## 2020-09-14 NOTE — Progress Notes (Signed)
Patient ID: Tammy Blankenship, female   DOB: Aug 20, 1970, 50 y.o.   MRN: [id number] History of Present Illness: Tammy Blankenship is a 50 year old white female,single, G0P0 in for a well woman gyn exam and pap. PCP is Dr Nevada Crane.   Current Medications, Allergies, Past Medical History, Past Surgical History, Family History and Social History were reviewed in [employer] record.     Review of Systems: Patient denies any headaches, hearing loss, fatigue, blurred vision, shortness of breath, chest pain, abdominal pain, problems with bowel movements, urination, or intercourse. No joint pain or mood swings.     Physical Exam:BP 130/90 (BP Location: Left Arm, Patient Position: Sitting, Cuff Size: Normal)   Pulse 76   Ht 5\' 2"  (1.575 m)   Wt 177 lb 3.2 oz (80.4 kg)   LMP 08/20/2020   BMI 32.41 kg/m   General:  Well developed, well nourished, no acute distress Skin:  Warm and dry Neck:  Midline trachea, normal thyroid, good ROM, no lymphadenopathy Lungs; Clear to auscultation bilaterally Breast:  No dominant palpable mass, retraction, or nipple discharge Cardiovascular: Regular rate and rhythm Abdomen:  Soft, non tender, no hepatosplenomegaly Pelvic:  External genitalia is normal in appearance, no lesions.  The vagina is normal in appearance. Urethra has no lesions or masses. The cervix is smooth, pap with HR HPV genotyping performed.Marland Kitchen  Uterus is felt to be normal size, shape, and contour.  No adnexal masses or tenderness noted.Bladder is non tender, no masses felt. Rectal: Good sphincter tone, no polyps,  + hemorrhoids felt.  Hemoccult negative. Extremities/musculoskeletal:  No swelling or varicosities noted, no clubbing or cyanosis Psych:  No mood changes, alert and cooperative,seems happy AA is 0  Fall risk is low PHQ 2 score is 0 GAD 7 score is 1  Upstream - 09/14/20 1336       Pregnancy Intention Screening   Does the patient want to become pregnant in the next year? No     Does the patient's partner want to become pregnant in the next year? No    Would the patient like to discuss contraceptive options today? No      Contraception Wrap Up   Current Method No Method - Other Reason    End Method No Method - Other Reason    Contraception Counseling Provided No            Examination chaperoned by Tish RN  Impression and Plan: 1. Encounter for gynecological examination with Papanicolaou smear of cervix Pap sent Physical in 1 year Pap in 3 if normal  Will check labs  - Cytology - PAP - CBC - Comprehensive metabolic panel - TSH - Lipid panel  2. Encounter for screening fecal occult blood testing  - POCT occult blood stool  3. Hemorrhoids, unspecified hemorrhoid type Use TUCKS pad Get Anusol or preparation H, OTC  4. Anxiety Will refill Buspar Meds ordered this encounter  Medications   busPIRone (BUSPAR) 5 MG tablet    Sig: TAKE 1 TABLET BY MOUTH 2 TIMES DAILY.    Dispense:  180 tablet    Refill:  4    Order Specific Question:   Supervising Provider    Answer:   Elonda Husky, LUTHER H [[id number]]     5. Low vitamin D level  - VITAMIN D 25 Hydroxy (Vit-D Deficiency, Fractures)

## 2020-09-15 LAB — COMPREHENSIVE METABOLIC PANEL
ALT: 16 IU/L (ref 0–32)
AST: 22 IU/L (ref 0–40)
Albumin/Globulin Ratio: 1.8 (ref 1.2–2.2)
Albumin: 4.4 g/dL (ref 3.8–4.8)
Alkaline Phosphatase: 65 IU/L (ref 44–121)
BUN/Creatinine Ratio: 15 (ref 9–23)
BUN: 11 mg/dL (ref 6–24)
Bilirubin Total: 0.4 mg/dL (ref 0.0–1.2)
CO2: 22 mmol/L (ref 20–29)
Calcium: 9.4 mg/dL (ref 8.7–10.2)
Chloride: 102 mmol/L (ref 96–106)
Creatinine, Ser: 0.75 mg/dL (ref 0.57–1.00)
Globulin, Total: 2.4 g/dL (ref 1.5–4.5)
Glucose: 79 mg/dL (ref 65–99)
Potassium: 4.8 mmol/L (ref 3.5–5.2)
Sodium: 138 mmol/L (ref 134–144)
Total Protein: 6.8 g/dL (ref 6.0–8.5)
eGFR: 97 mL/min/{1.73_m2} (ref >59)

## 2020-09-15 LAB — CBC
Hematocrit: 44.4 % (ref 34.0–46.6)
Hemoglobin: 14.5 g/dL (ref 11.1–15.9)
MCH: 28.6 pg (ref 26.6–33.0)
MCHC: 32.7 g/dL (ref 31.5–35.7)
MCV: 88 fL (ref 79–97)
Platelets: 252 10*3/uL (ref 150–450)
RBC: 5.07 x10E6/uL (ref 3.77–5.28)
RDW: 13.1 % (ref 11.7–15.4)
WBC: 9.4 10*3/uL (ref 3.4–10.8)

## 2020-09-15 LAB — LIPID PANEL
Chol/HDL Ratio: 3.2 ratio (ref 0.0–4.4)
Cholesterol, Total: 211 mg/dL — ABNORMAL HIGH (ref 100–199)
HDL: 65 mg/dL (ref >39)
LDL Chol Calc (NIH): 122 mg/dL — ABNORMAL HIGH (ref 0–99)
Triglycerides: 136 mg/dL (ref 0–149)
VLDL Cholesterol Cal: 24 mg/dL (ref 5–40)

## 2020-09-15 LAB — TSH: TSH: 3.03 u[IU]/mL (ref 0.450–4.500)

## 2020-09-15 LAB — VITAMIN D 25 HYDROXY (VIT D DEFICIENCY, FRACTURES): Vit D, 25-Hydroxy: 35.5 ng/mL (ref 30.0–100.0)

## 2020-09-20 LAB — CYTOLOGY - PAP
Comment: NEGATIVE
Diagnosis: NEGATIVE
High risk HPV: NEGATIVE

## 2020-11-02 ENCOUNTER — Other Ambulatory Visit: Payer: Self-pay

## 2020-11-02 ENCOUNTER — Ambulatory Visit (HOSPITAL_COMMUNITY)
Admission: RE | Admit: 2020-11-02 | Discharge: 2020-11-02 | Disposition: A | Discharge status: Home / Self Care | Payer: 59 | Source: Ambulatory Visit | Attending: Internal Medicine | Admitting: Internal Medicine

## 2020-11-02 DIAGNOSIS — Z1231 Encounter for screening mammogram for malignant neoplasm of breast: Secondary | ICD-10-CM | POA: Insufficient documentation

## 2020-12-19 ENCOUNTER — Other Ambulatory Visit (HOSPITAL_COMMUNITY): Payer: Self-pay

## 2020-12-28 ENCOUNTER — Other Ambulatory Visit (HOSPITAL_COMMUNITY): Payer: Self-pay

## 2020-12-30 ENCOUNTER — Other Ambulatory Visit (HOSPITAL_COMMUNITY): Payer: Self-pay

## 2021-03-19 ENCOUNTER — Other Ambulatory Visit (HOSPITAL_COMMUNITY): Payer: Self-pay

## 2021-03-24 ENCOUNTER — Other Ambulatory Visit (HOSPITAL_COMMUNITY): Payer: Self-pay

## 2021-04-19 DIAGNOSIS — H5213 Myopia, bilateral: Secondary | ICD-10-CM | POA: Diagnosis not present

## 2021-07-10 ENCOUNTER — Other Ambulatory Visit (HOSPITAL_COMMUNITY): Payer: Self-pay

## 2021-09-26 ENCOUNTER — Other Ambulatory Visit (HOSPITAL_COMMUNITY): Payer: Self-pay | Admitting: Internal Medicine

## 2021-09-26 DIAGNOSIS — Z1231 Encounter for screening mammogram for malignant neoplasm of breast: Secondary | ICD-10-CM

## 2021-09-28 ENCOUNTER — Other Ambulatory Visit: Payer: Self-pay | Admitting: Adult Health

## 2021-09-28 ENCOUNTER — Other Ambulatory Visit (HOSPITAL_COMMUNITY): Payer: Self-pay

## 2021-09-28 MED ORDER — BUSPIRONE HCL 5 MG PO TABS
ORAL_TABLET | Freq: Two times a day (BID) | ORAL | 4 refills | Status: DC
  Filled 2021-09-28: qty 180, 90d supply, fill #0
  Filled 2022-01-01: qty 180, 90d supply, fill #1
  Filled 2022-04-01: qty 180, 90d supply, fill #2
  Filled 2022-06-20: qty 180, 90d supply, fill #3
  Filled 2022-09-16: qty 57, 30d supply, fill #4
  Filled 2022-09-16: qty 123, 60d supply, fill #4

## 2021-10-30 ENCOUNTER — Other Ambulatory Visit: Payer: Self-pay

## 2021-11-12 ENCOUNTER — Encounter: Payer: Self-pay | Admitting: Adult Health

## 2021-11-12 ENCOUNTER — Ambulatory Visit (HOSPITAL_COMMUNITY)
Admission: RE | Admit: 2021-11-12 | Discharge: 2021-11-12 | Disposition: A | Discharge status: Home / Self Care | Payer: 59 | Source: Ambulatory Visit | Attending: Internal Medicine | Admitting: Internal Medicine

## 2021-11-12 ENCOUNTER — Other Ambulatory Visit (HOSPITAL_COMMUNITY)
Admission: RE | Admit: 2021-11-12 | Discharge: 2021-11-12 | Disposition: A | Discharge status: Home / Self Care | Payer: 59 | Source: Ambulatory Visit

## 2021-11-12 ENCOUNTER — Ambulatory Visit (INDEPENDENT_AMBULATORY_CARE_PROVIDER_SITE_OTHER): Payer: 59 | Admitting: Adult Health

## 2021-11-12 VITALS — BP 150/97 | HR 84 | Ht 61.25 in | Wt 170.0 lb

## 2021-11-12 DIAGNOSIS — Z1211 Encounter for screening for malignant neoplasm of colon: Secondary | ICD-10-CM

## 2021-11-12 DIAGNOSIS — F419 Anxiety disorder, unspecified: Secondary | ICD-10-CM

## 2021-11-12 DIAGNOSIS — N951 Menopausal and female climacteric states: Secondary | ICD-10-CM

## 2021-11-12 DIAGNOSIS — N926 Irregular menstruation, unspecified: Secondary | ICD-10-CM | POA: Diagnosis not present

## 2021-11-12 DIAGNOSIS — Z01419 Encounter for gynecological examination (general) (routine) without abnormal findings: Secondary | ICD-10-CM | POA: Insufficient documentation

## 2021-11-12 DIAGNOSIS — Z1231 Encounter for screening mammogram for malignant neoplasm of breast: Secondary | ICD-10-CM | POA: Insufficient documentation

## 2021-11-12 DIAGNOSIS — Z1212 Encounter for screening for malignant neoplasm of rectum: Secondary | ICD-10-CM

## 2021-11-12 DIAGNOSIS — R030 Elevated blood-pressure reading, without diagnosis of hypertension: Secondary | ICD-10-CM | POA: Diagnosis not present

## 2021-11-12 LAB — HEMOCCULT GUIAC POC 1CARD (OFFICE): Fecal Occult Blood, POC: NEGATIVE

## 2021-11-12 NOTE — Progress Notes (Signed)
Patient ID: [name], female   DOB: January 22, 1971, 51 y.o.   MRN: [id number] History of Present Illness:  Aubryana is a 51 year old white female,single, G0P0, in for a well woman gyn exam and she wants a pap. 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 She has been doing intermittent fasting and has lost weight and feels better. She is having irregular periods. PCP is Dr Nevada Crane.   Current Medications, Allergies, Past Medical History, Past Surgical History, Family History and Social History were reviewed in [employer] record.     Review of Systems: Patient denies any headaches, hearing loss, fatigue, blurred vision, shortness of breath, chest pain, abdominal pain, problems with bowel movements, urination, or intercourse(not active). No joint pain or mood swings.  +irregular periods   Physical Exam:BP (!) 150/97 (BP Location: Right Arm, Cuff Size: Normal)   Pulse 84   Ht 5' 1.25" (1.556 m)   Wt 170 lb (77.1 kg)   LMP 09/24/2021   BMI 31.86 kg/m   General:  Well developed, well nourished, no acute distress Skin:  Warm and dry Neck:  Midline trachea, normal thyroid, good ROM, no lymphadenopathy Lungs; Clear to auscultation bilaterally Breast:  No dominant palpable mass, retraction, or nipple discharge Cardiovascular: Regular rate and rhythm Abdomen:  Soft, non tender, no hepatosplenomegaly Pelvic:  External genitalia is normal in appearance, no lesions.  The vagina is normal in appearance. Urethra has no lesions or masses. The cervix is smooth, pap with HR HPV genotyping performed.  Uterus is felt to be normal size, shape, and contour.  No adnexal masses or tenderness noted.Bladder is non tender, no masses felt. Rectal: Good sphincter tone, no polyps, or hemorrhoids felt.  Hemoccult negative. Extremities/musculoskeletal:  No swelling or varicosities noted, no clubbing or cyanosis Psych:  No mood changes, alert and cooperative,seems happy AA is 1 Fall risk is low     11/12/2021   10:50 AM 09/14/2020    1:40 PM 09/14/2020    1:39 PM  Depression screen PHQ 2/9  Decreased Interest 0 0 0  Down, Depressed, Hopeless 0 0 0  PHQ - 2 Score 0 0 0  Altered sleeping 2  0  Tired, decreased energy 1  1  Change in appetite 1  0  Feeling bad or failure about yourself  0  0  Trouble concentrating 2  1  Moving slowly or fidgety/restless 0  0  Suicidal thoughts 0  0  PHQ-9 Score 6  2       11/12/2021   10:50 AM 09/14/2020    1:39 PM 07/21/2019   10:33 AM  GAD 7 : Generalized Anxiety Score  Nervous, Anxious, on Edge 1 0 2  Control/stop worrying 0 0 2  Worry too much - different things 1 0 2  Trouble relaxing 1 0 2  Restless 0 0 1  Easily annoyed or irritable '1 1 1  '$ Afraid - awful might happen 0 0 2  Total GAD 7 Score '4 1 12  '$ Anxiety Difficulty   Not difficult at all    Upstream - 11/12/21 1045       Pregnancy Intention Screening   Does the patient want to become pregnant in the next year? No    Does the patient's partner want to become pregnant in the next year? No    Would the patient like to discuss contraceptive options today? No      Contraception Wrap Up   Current Method Abstinence  End Method Abstinence    Contraception Counseling Provided No              Examination chaperoned by Levy Pupa LPN  Impression and Plan: 1. Encounter for gynecological examination with Papanicolaou smear of cervix Pap sent Physical in 1 year Pap in 3 years if normal Had mammogram this am at [facility]  - Cytology - PAP( [place]) - CBC - Comprehensive metabolic panel - TSH - Lipid panel  2. Encounter for screening fecal occult blood testing Hemoccult was negative  - POCT occult blood stool  3. Anxiety Doing good on Buspar, 5 mg 1 bid, has refills   4. Peri-menopause Discussed symptoms   5. Irregular periods  6. Screening for colorectal cancer Referred to [facility] for colonoscopy - Ambulatory referral to Gastroenterology  7. Elevated BP  without diagnosis of hypertension Keep check at home and work and let me know if staying elevated

## 2021-11-13 LAB — CBC
Hematocrit: 45.3 % (ref 34.0–46.6)
Hemoglobin: 15 g/dL (ref 11.1–15.9)
MCH: 30 pg (ref 26.6–33.0)
MCHC: 33.1 g/dL (ref 31.5–35.7)
MCV: 91 fL (ref 79–97)
Platelets: 239 10*3/uL (ref 150–450)
RBC: 5 x10E6/uL (ref 3.77–5.28)
RDW: 12.2 % (ref 11.7–15.4)
WBC: 7 10*3/uL (ref 3.4–10.8)

## 2021-11-13 LAB — COMPREHENSIVE METABOLIC PANEL
ALT: 9 IU/L (ref 0–32)
AST: 17 IU/L (ref 0–40)
Albumin/Globulin Ratio: 1.6 (ref 1.2–2.2)
Albumin: 4.2 g/dL (ref 3.8–4.9)
Alkaline Phosphatase: 63 IU/L (ref 44–121)
BUN/Creatinine Ratio: 16 (ref 9–23)
BUN: 11 mg/dL (ref 6–24)
Bilirubin Total: 0.4 mg/dL (ref 0.0–1.2)
CO2: 22 mmol/L (ref 20–29)
Calcium: 9.7 mg/dL (ref 8.7–10.2)
Chloride: 101 mmol/L (ref 96–106)
Creatinine, Ser: 0.68 mg/dL (ref 0.57–1.00)
Globulin, Total: 2.7 g/dL (ref 1.5–4.5)
Glucose: 77 mg/dL (ref 70–99)
Potassium: 4.9 mmol/L (ref 3.5–5.2)
Sodium: 138 mmol/L (ref 134–144)
Total Protein: 6.9 g/dL (ref 6.0–8.5)
eGFR: 105 mL/min/{1.73_m2} (ref >59)

## 2021-11-13 LAB — TSH: TSH: 2 u[IU]/mL (ref 0.450–4.500)

## 2021-11-13 LAB — LIPID PANEL
Chol/HDL Ratio: 3.7 ratio (ref 0.0–4.4)
Cholesterol, Total: 217 mg/dL — ABNORMAL HIGH (ref 100–199)
HDL: 58 mg/dL (ref >39)
LDL Chol Calc (NIH): 142 mg/dL — ABNORMAL HIGH (ref 0–99)
Triglycerides: 97 mg/dL (ref 0–149)
VLDL Cholesterol Cal: 17 mg/dL (ref 5–40)

## 2021-11-14 ENCOUNTER — Encounter: Payer: Self-pay | Admitting: *Deleted

## 2021-11-14 LAB — CYTOLOGY - PAP
Adequacy: ABSENT
Comment: NEGATIVE
Diagnosis: NEGATIVE
High risk HPV: NEGATIVE

## 2022-01-01 ENCOUNTER — Other Ambulatory Visit (HOSPITAL_COMMUNITY): Payer: Self-pay

## 2022-02-03 IMAGING — MG MM DIGITAL SCREENING BILAT W/ TOMO AND CAD
6 of 10 series · 6 of 30 positions shown · non-contrast
Comparison: Previous exam(s).

CLINICAL DATA: Screening.

EXAM:
DIGITAL SCREENING BILATERAL MAMMOGRAM WITH TOMOSYNTHESIS AND CAD
TECHNIQUE: Bilateral screening digital craniocaudal and mediolateral oblique
mammograms were obtained. Bilateral screening digital breast
tomosynthesis was performed. The images were evaluated with
computer-aided detection.

[L CC synth-2D]
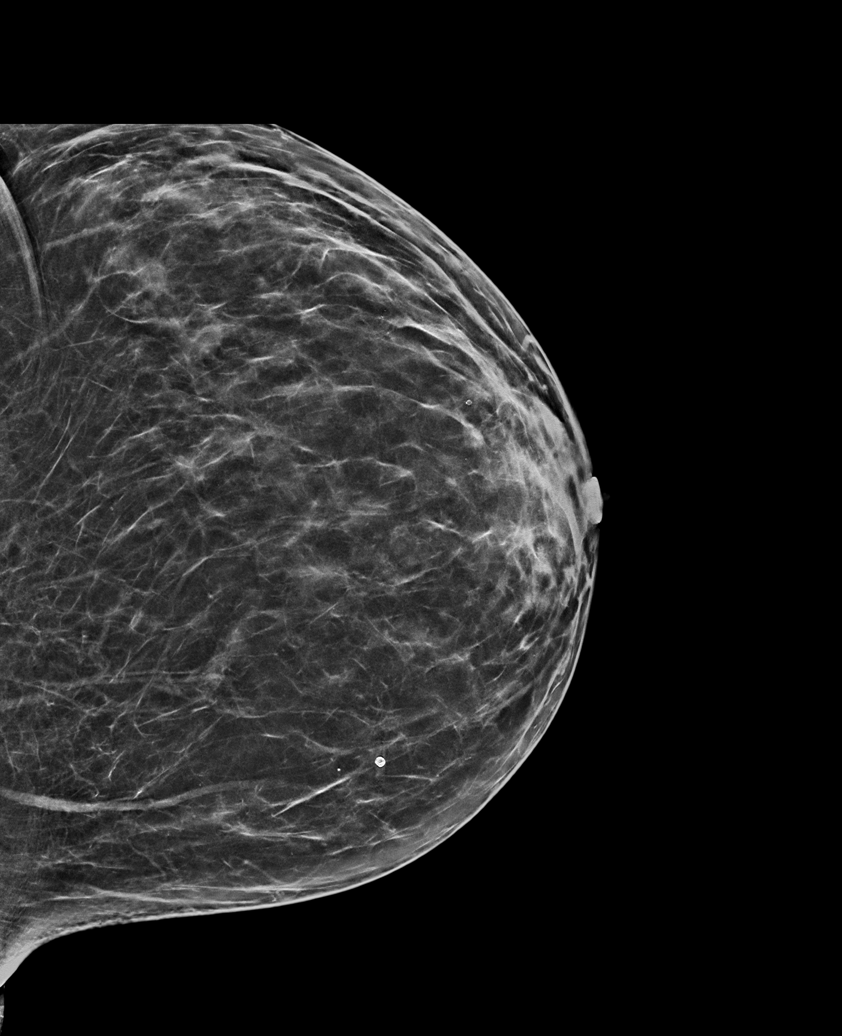

[R CC synth-2D (1 of 2)]
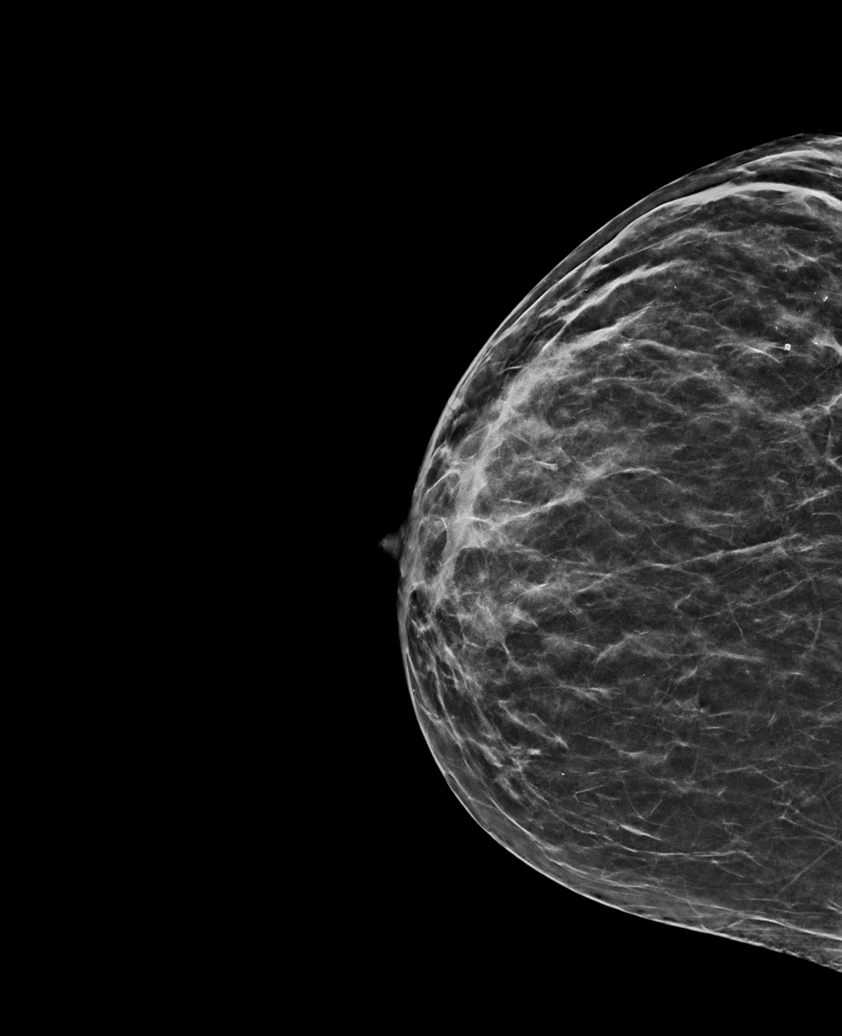

[L MLO synth-2D]
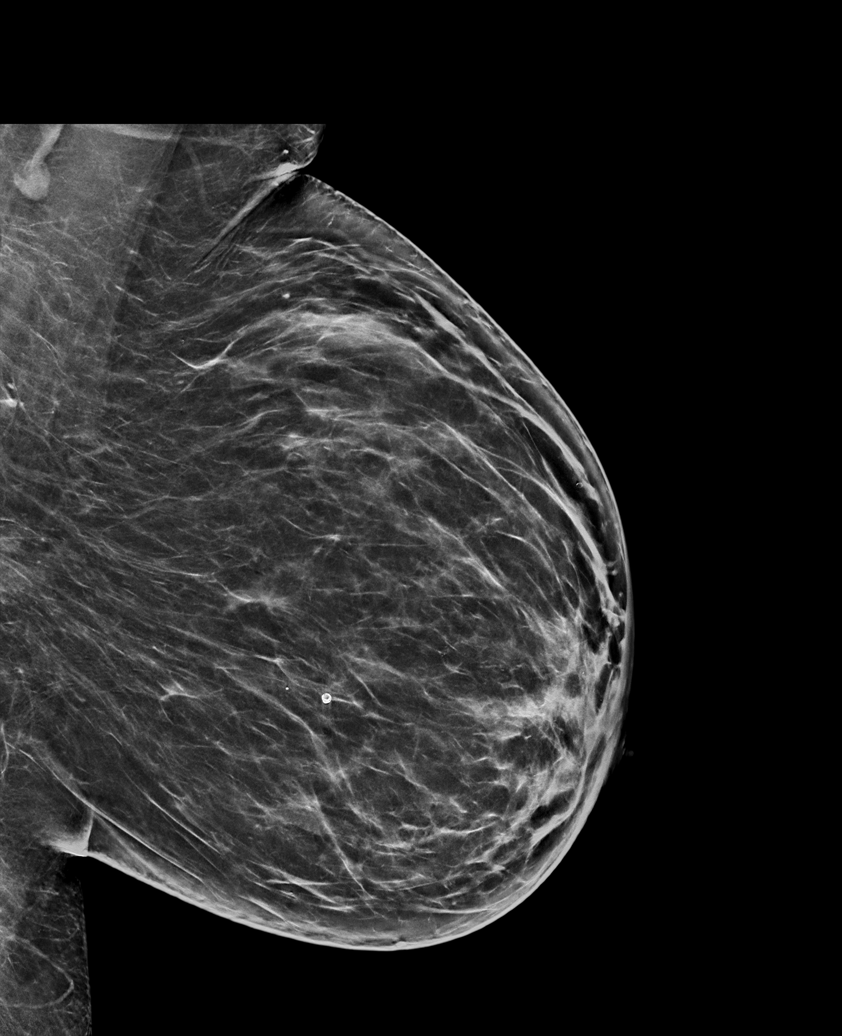

[R CC synth-2D (2 of 2)]
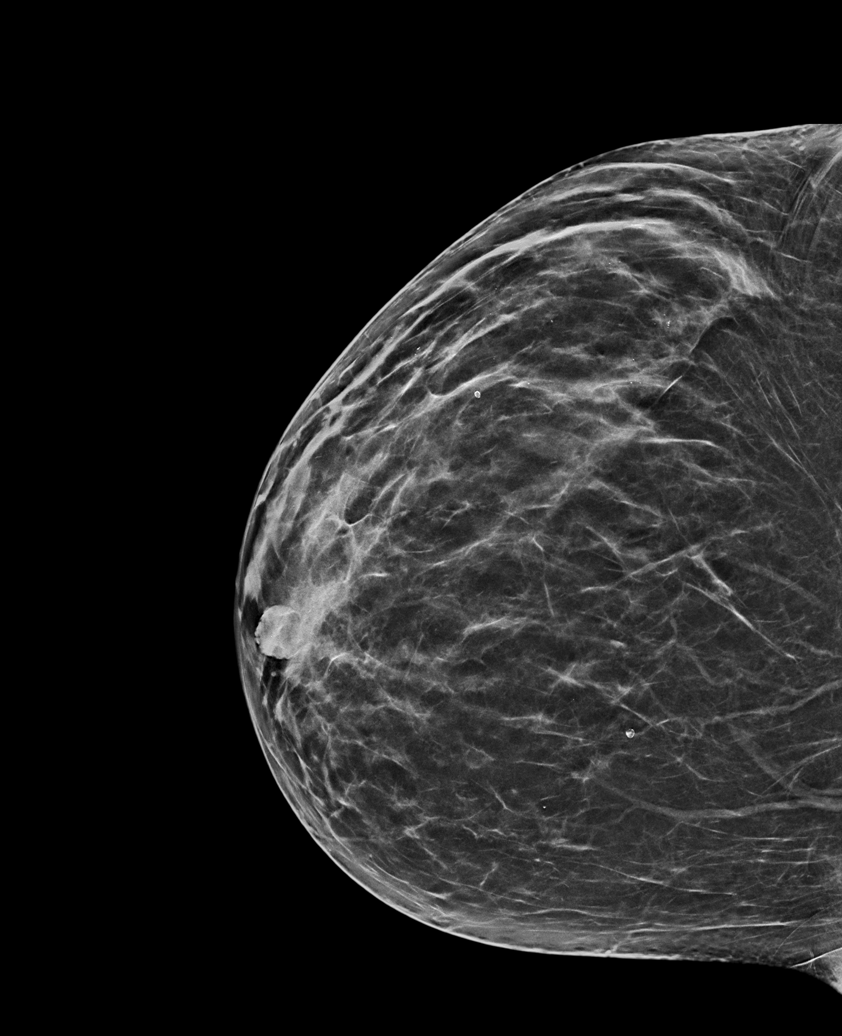

[R MLO synth-2D]
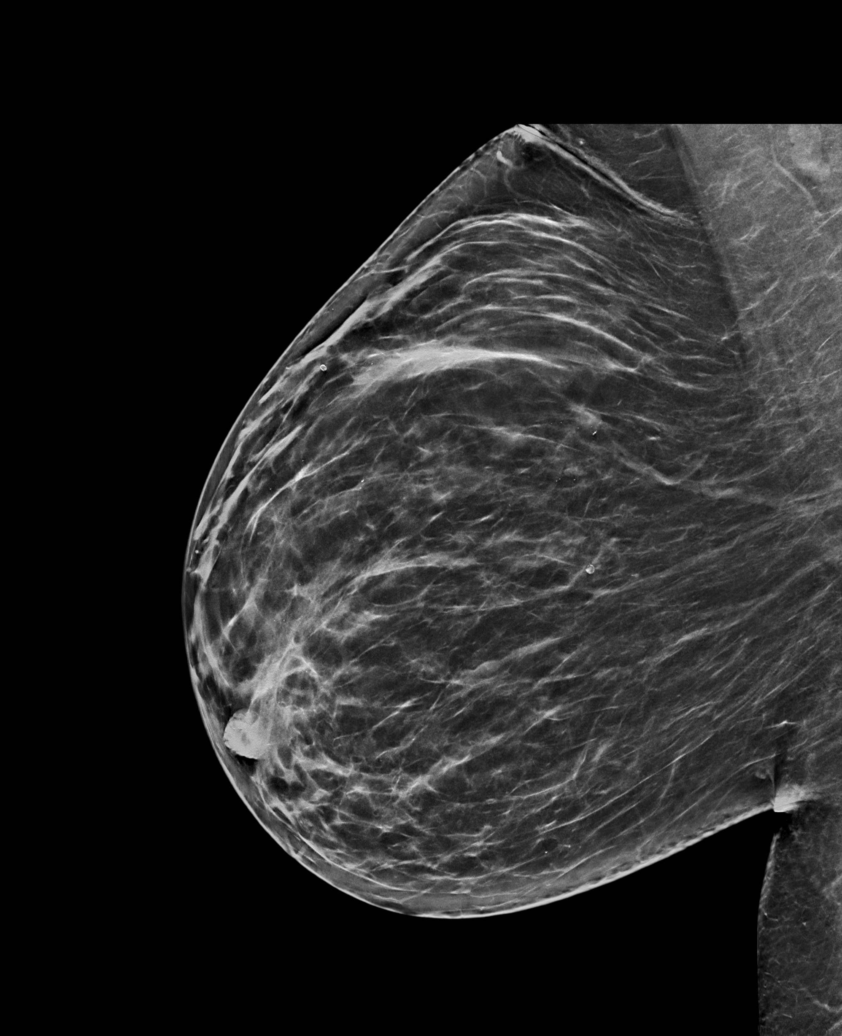

[R CC tomo · tomo slice 33/64.0]
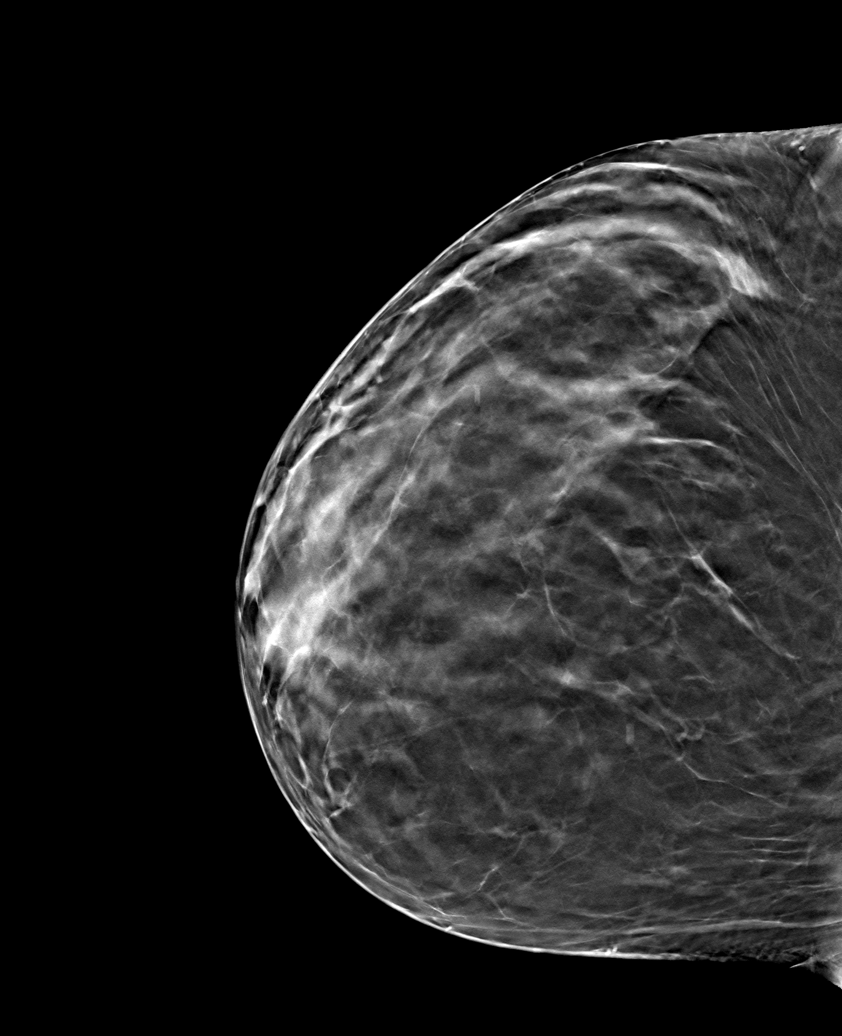

[6 of 30 positions shown; findings below may reference images not displayed]

ACR Breast Density Category b: There are scattered areas of
fibroglandular density.
FINDINGS: There are no findings suspicious for malignancy.
IMPRESSION: No mammographic evidence of malignancy. A result letter of this
screening mammogram will be mailed directly to the patient.

RECOMMENDATION:
Screening mammogram in one year. (Code:51-O-LD2)

BI-RADS CATEGORY  1: Negative.

## 2022-03-25 ENCOUNTER — Other Ambulatory Visit (HOSPITAL_COMMUNITY): Payer: Self-pay

## 2022-04-01 ENCOUNTER — Other Ambulatory Visit (HOSPITAL_COMMUNITY): Payer: Self-pay

## 2022-06-20 ENCOUNTER — Other Ambulatory Visit (HOSPITAL_COMMUNITY): Payer: Self-pay

## 2022-09-16 ENCOUNTER — Other Ambulatory Visit (HOSPITAL_COMMUNITY): Payer: Self-pay

## 2022-09-17 ENCOUNTER — Encounter (HOSPITAL_COMMUNITY): Payer: Self-pay | Admitting: Emergency Medicine

## 2022-09-17 ENCOUNTER — Other Ambulatory Visit: Payer: Self-pay

## 2022-09-17 ENCOUNTER — Emergency Department (HOSPITAL_COMMUNITY): Payer: Commercial Managed Care - PPO

## 2022-09-17 ENCOUNTER — Emergency Department (HOSPITAL_COMMUNITY)
Admission: EM | Admit: 2022-09-17 | Discharge: 2022-09-17 | Disposition: A | Discharge status: Home / Self Care | Payer: Commercial Managed Care - PPO | Attending: Emergency Medicine | Admitting: Emergency Medicine

## 2022-09-17 DIAGNOSIS — R000 Tachycardia, unspecified: Secondary | ICD-10-CM | POA: Insufficient documentation

## 2022-09-17 DIAGNOSIS — R0789 Other chest pain: Secondary | ICD-10-CM | POA: Insufficient documentation

## 2022-09-17 DIAGNOSIS — R002 Palpitations: Secondary | ICD-10-CM | POA: Diagnosis not present

## 2022-09-17 LAB — BASIC METABOLIC PANEL
Anion gap: 10 (ref 5–15)
BUN: 13 mg/dL (ref 6–20)
CO2: 23 mmol/L (ref 22–32)
Calcium: 8.9 mg/dL (ref 8.9–10.3)
Chloride: 102 mmol/L (ref 98–111)
Creatinine, Ser: 0.69 mg/dL (ref 0.44–1.00)
GFR, Estimated: >60 mL/min (ref >60)
Glucose, Bld: 110 mg/dL — ABNORMAL HIGH (ref 70–99)
Potassium: 3.8 mmol/L (ref 3.5–5.1)
Sodium: 135 mmol/L (ref 135–145)

## 2022-09-17 LAB — CBC WITH DIFFERENTIAL/PLATELET
Abs Immature Granulocytes: 0.02 10*3/uL (ref 0.00–0.07)
Basophils Absolute: 0 10*3/uL (ref 0.0–0.1)
Basophils Relative: 0 %
Eosinophils Absolute: 0.1 10*3/uL (ref 0.0–0.5)
Eosinophils Relative: 1 %
HCT: 45.2 % (ref 36.0–46.0)
Hemoglobin: 14.7 g/dL (ref 12.0–15.0)
Immature Granulocytes: 0 %
Lymphocytes Relative: 22 %
Lymphs Abs: 1.5 10*3/uL (ref 0.7–4.0)
MCH: 29.5 pg (ref 26.0–34.0)
MCHC: 32.5 g/dL (ref 30.0–36.0)
MCV: 90.6 fL (ref 80.0–100.0)
Monocytes Absolute: 0.4 10*3/uL (ref 0.1–1.0)
Monocytes Relative: 6 %
Neutro Abs: 4.7 10*3/uL (ref 1.7–7.7)
Neutrophils Relative %: 71 %
Platelets: 228 10*3/uL (ref 150–400)
RBC: 4.99 MIL/uL (ref 3.87–5.11)
RDW: 13.1 % (ref 11.5–15.5)
WBC: 6.7 10*3/uL (ref 4.0–10.5)
nRBC: 0 % (ref 0.0–0.2)

## 2022-09-17 LAB — TROPONIN I (HIGH SENSITIVITY): Troponin I (High Sensitivity): 2 ng/L (ref <18)

## 2022-09-17 LAB — D-DIMER, QUANTITATIVE: D-Dimer, Quant: 0.46 ug/mL-FEU (ref 0.00–0.50)

## 2022-09-17 LAB — MAGNESIUM: Magnesium: 1.9 mg/dL (ref 1.7–2.4)

## 2022-09-17 LAB — BRAIN NATRIURETIC PEPTIDE: B Natriuretic Peptide: 41.5 pg/mL (ref 0.0–100.0)

## 2022-09-17 LAB — HCG, SERUM, QUALITATIVE: Preg, Serum: NEGATIVE

## 2022-09-17 MED ORDER — LACTATED RINGERS IV BOLUS
500.0000 mL | Freq: Once | INTRAVENOUS | Status: AC
  Administered 2022-09-17: 500 mL via INTRAVENOUS

## 2022-09-17 NOTE — Discharge Instructions (Signed)
The testing done today in the emergency department is reassuring.  Stay hydrated. Check your blood pressures at home.  If they are consistently elevated, you would benefit from initiation of a blood pressure medication.  You should hear from cardiology office within the next few days to set up a follow-up appointment.  If you do not hear from them this week, call the number below.   Return to the emergency department for any new or worsening symptoms of concern.

## 2022-09-17 NOTE — ED Provider Notes (Signed)
[place] EMERGENCY DEPARTMENT AT [facility] Provider Note   CSN: [id number] Arrival date & time: 09/17/22  0535     History  Chief Complaint  Patient presents with   Tachycardia    Tammy Blankenship is a 52 y.o. female.  HPI Patient presents for palpitations.  Medical history includes fibroids, ovarian cyst, anxiety, HLD.  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  Over the past 5 days, she has had intermittent palpitations.  She will also experience some chest tightness.  Symptoms are not related to exertion.  Patient has remained active and typically works out every morning.  Several years ago, she did see cardiology for similar symptoms.  She wore a Zio patch monitor for a week.  Data from the monitor showed very short runs of SVT and some intermittent ectopic beats.  She was not started on any medications at the time.  She has not had any similar symptoms until now.  When patient would experience the symptoms, she would typically not check her heart rate.  Yesterday, she did check it and heart rate was elevated in the range of 105.  Her typical resting heart rate is in 70s.  She does feel like she does not drink enough water throughout the day.  Currently, she is asymptomatic.    Home Medications Prior to Admission medications   Medication Sig Start Date End Date Taking? Authorizing Provider  busPIRone (BUSPAR) 5 MG tablet TAKE 1 TABLET BY MOUTH 2 TIMES DAILY. 09/28/21 12/27/22  Adline Potter, NP  VITAMIN D PO Take 800 Units by mouth daily.    [provider]      Allergies    Patient has no known allergies.    Review of Systems   Review of Systems  Respiratory:  Positive for chest tightness.   Cardiovascular:  Positive for palpitations.  All other systems reviewed and are negative.   Physical Exam Updated Vital Signs BP (!) 155/95 (BP Location: Right Arm)   Pulse (!) 104   Temp 97.8 F (36.6 C) (Oral)   Resp 18   SpO2  100%  Physical Exam Vitals and nursing note reviewed.  Constitutional:      General: She is not in acute distress.    Appearance: Normal appearance. She is well-developed. She is not ill-appearing, toxic-appearing or diaphoretic.  HENT:     Head: Normocephalic and atraumatic.     Right Ear: External ear normal.     Left Ear: External ear normal.     Nose: Nose normal.     Mouth/Throat:     Mouth: Mucous membranes are moist.  Eyes:     Extraocular Movements: Extraocular movements intact.     Conjunctiva/sclera: Conjunctivae normal.  Cardiovascular:     Rate and Rhythm: Normal rate and regular rhythm.     Heart sounds: No murmur heard. Pulmonary:     Effort: Pulmonary effort is normal. No respiratory distress.     Breath sounds: Normal breath sounds. No wheezing or rales.  Chest:     Chest wall: No tenderness.  Abdominal:     General: There is no distension.     Palpations: Abdomen is soft.     Tenderness: There is no abdominal tenderness.  Musculoskeletal:        General: No swelling. Normal range of motion.     Cervical back: Normal range of motion and neck supple.     Right lower leg: No edema.  Left lower leg: No edema.  Skin:    General: Skin is warm and dry.     Coloration: Skin is not jaundiced or pale.  Neurological:     General: No focal deficit present.     Mental Status: She is alert.     Cranial Nerves: No cranial nerve deficit.     Sensory: No sensory deficit.     Motor: No weakness.     Coordination: Coordination normal.  Psychiatric:        Mood and Affect: Mood normal.        Behavior: Behavior normal.        Thought Content: Thought content normal.        Judgment: Judgment normal.     ED Results / Procedures / Treatments   Labs (all labs ordered are listed, but only abnormal results are displayed) Labs Reviewed  BASIC METABOLIC PANEL - Abnormal; Notable for the following components:      Result Value   Glucose, Bld 110 (*)    All other  components within normal limits  HCG, SERUM, QUALITATIVE  MAGNESIUM  BRAIN NATRIURETIC PEPTIDE  D-DIMER, QUANTITATIVE  CBC WITH DIFFERENTIAL/PLATELET  CBC WITH DIFFERENTIAL/PLATELET  TROPONIN I (HIGH SENSITIVITY)  TROPONIN I (HIGH SENSITIVITY)    EKG EKG Interpretation Date/Time:  Tuesday September 17 2022 05:46:06 EDT Ventricular Rate:  98 PR Interval:  136 QRS Duration:  85 QT Interval:  344 QTC Calculation: 440 R Axis:   70  Text Interpretation: Sinus rhythm Right atrial enlargement Low voltage, precordial leads Confirmed by Gloris Manchester ([id number]) on 09/17/2022 6:36:34 AM  Radiology DG Chest Portable 1 View  Result Date: 09/17/2022 CLINICAL DATA:  Tachycardia EXAM: PORTABLE CHEST 1 VIEW COMPARISON:  None Available. FINDINGS: Normal heart size and mediastinal contours. No acute infiltrate or edema. No effusion or pneumothorax. No acute osseous findings. IMPRESSION: Negative portable chest. Electronically Signed   By: Tiburcio Pea M.D.   On: 09/17/2022 06:36    Procedures Procedures    Medications Ordered in ED Medications  lactated ringers bolus 500 mL ( Intravenous Infusion Verify 09/17/22 0703)    ED Course/ Medical Decision Making/ A&P                             Medical Decision Making Amount and/or Complexity of Data Reviewed Labs: ordered. Radiology: ordered.   This patient presents to the ED for concern of palpitations, this involves an extensive number of treatment options, and is a complaint that carries with it a high risk of complications and morbidity.  The differential diagnosis includes arrhythmia, ectopic beats, infection, electrolyte disturbances, anemia, dehydration, anxiety   Co morbidities that complicate the patient evaluation  fibroids, ovarian cyst, anxiety, HLD   Additional history obtained:  Additional history obtained from N/A External records from outside source obtained and reviewed including EMR   Lab Tests:  I Ordered, and personally  interpreted labs.  The pertinent results include: Normal hemoglobin, no leukocytosis, normal electrolytes, normal troponin, normal D-dimer, normal BNP   Imaging Studies ordered:  I ordered imaging studies including chest x-ray I independently visualized and interpreted imaging which showed no acute findings I agree with the radiologist interpretation   Cardiac Monitoring: / EKG:  The patient was maintained on a cardiac monitor.  I personally viewed and interpreted the cardiac monitored which showed an underlying rhythm of: Sinus rhythm  Problem List / ED Course / Critical interventions / Medication management  Patient presents for intermittent palpitations and chest pressure over the past 5 days.  She is asymptomatic on arrival in the ED.  Initial vital signs are notable for moderate hypertension.  She attributes this to whitecoat syndrome.  Patient is well-appearing on exam.  I do not appreciate any cardiac rubs or murmurs.  Patient was placed on cardiac monitor.  Monitor shows a sinus rhythm.  Heart rate is in the range of 90.  When she stands up, heart rate elevates the range of 110.  It subsequently normalizes in the range of 90.  Patient has been active and states that she does a poor job of staying hydrated.  Workup was initiated.  Bolus of IV fluids was ordered.  Patient's lab work is reassuring.  She maintained normal sinus rhythm while in the ED.  She was advised to check blood pressures at home, stay hydrated, and to follow-up with cardiology.  She was discharged in good condition. I ordered medication including IV fluids for hydration Reevaluation of the patient after these medicines showed that the patient improved I have reviewed the patients home medicines and have made adjustments as needed   Social Determinants of Health:  Has access to outpatient care        Final Clinical Impression(s) / ED Diagnoses Final diagnoses:  Palpitations    Rx / DC Orders ED Discharge  Orders          Ordered    Ambulatory referral to Cardiology       Comments: If you have not heard from the Cardiology office within the next 72 hours please call [phone].   09/17/22 9147              Gloris Manchester, MD 09/17/22 0730

## 2022-09-17 NOTE — ED Triage Notes (Signed)
Pt c/o feeling like she was having palpitations and tachycardia intermittently x 3-5 days. States that her resting HR in the 70s.

## 2022-09-18 ENCOUNTER — Telehealth: Payer: Self-pay | Admitting: Adult Health

## 2022-09-18 NOTE — Telephone Encounter (Signed)
Pt states she would like a call from [place]. Pt states she was in the ER for chest pains. Please advise

## 2022-09-18 NOTE — Telephone Encounter (Signed)
Seen in ER yesterday for palpitations and BP was slightly elevated, has cardiology appt in August, but will see Monday July 8 at 9:10 am, can't see Dr Margo Aye till reapplies

## 2022-09-21 ENCOUNTER — Other Ambulatory Visit (HOSPITAL_COMMUNITY): Payer: Self-pay

## 2022-09-23 ENCOUNTER — Other Ambulatory Visit (HOSPITAL_COMMUNITY): Payer: Self-pay | Admitting: Adult Health

## 2022-09-23 ENCOUNTER — Ambulatory Visit: Payer: Commercial Managed Care - PPO | Admitting: Adult Health

## 2022-09-23 ENCOUNTER — Encounter: Payer: Self-pay | Admitting: Adult Health

## 2022-09-23 VITALS — BP 139/91 | HR 98 | Ht 62.0 in | Wt 155.0 lb

## 2022-09-23 DIAGNOSIS — Z1231 Encounter for screening mammogram for malignant neoplasm of breast: Secondary | ICD-10-CM

## 2022-09-23 DIAGNOSIS — R030 Elevated blood-pressure reading, without diagnosis of hypertension: Secondary | ICD-10-CM | POA: Diagnosis not present

## 2022-09-23 NOTE — Progress Notes (Signed)
  Subjective:     Patient ID: Tammy Blankenship, female   DOB: 1971-03-15, 52 y.o.   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  Her BP at home has been in 130's over high 80's to low 90's, and has felt more jittery in the mornings. She has lost 15 lbs in last year by increasing exercise and intermittent fasting. She has appt with cardiology 10/28/22. She had stopped Buspar but has restarted recently.      Component Value Date/Time   DIAGPAP  11/12/2021 1052    - Negative for intraepithelial lesion or malignancy (NILM)   DIAGPAP  09/14/2020 1341    - Negative for intraepithelial lesion or malignancy (NILM)   DIAGPAP  03/31/2017 0000    NEGATIVE FOR INTRAEPITHELIAL LESIONS OR MALIGNANCY.   HPVHIGH Negative 11/12/2021 1052   HPVHIGH Negative 09/14/2020 1341   ADEQPAP  11/12/2021 1052    Satisfactory for evaluation; transformation zone component ABSENT.   ADEQPAP  09/14/2020 1341    Satisfactory for evaluation; transformation zone component PRESENT.   ADEQPAP  03/31/2017 0000    Satisfactory for evaluation  endocervical/transformation zone component PRESENT.    Review of Systems More jittery in mornings Heart rate may go up with standing up at times, then go back down  Reviewed past medical,surgical, social and family history. Reviewed medications and allergies.     Objective:   Physical Exam BP (!) 139/91 (BP Location: Right Arm, Patient Position: Sitting, Cuff Size: Normal)   Pulse 98   Ht 5\' 2"  (1.575 m)   Wt 155 lb (70.3 kg)   BMI 28.35 kg/m     Skin warm and dry. Lungs: clear to ausculation bilaterally. Cardiovascular: regular rate and rhythm.  Reviewed labs and EKG with her.  Fall risk is low  Upstream - 09/23/22 0914       Pregnancy Intention Screening   Does the patient want to become pregnant in the next year? No    Does the patient's partner want to become pregnant  in the next year? No    Would the patient like to discuss contraceptive options today? No      Contraception Wrap Up   Current Method Abstinence    End Method Abstinence    Contraception Counseling Provided No             Assessment:     1. Elevated BP without diagnosis of hypertension Keep BP log Try decrease fasting from 16 hours to 12 and eat some protein in AM and try to hydrate better Continue exercising Keep cardiology appt 10/28/22     Plan:     -35 minutes of total time was spent for this patient encounter, including face to face counseling with the patient and reviewing chart with patient, and documentation.  Follow up for GYN physical after 11/13/22

## 2022-10-28 ENCOUNTER — Ambulatory Visit: Payer: Commercial Managed Care - PPO | Attending: Internal Medicine | Admitting: Internal Medicine

## 2022-10-28 ENCOUNTER — Encounter: Payer: Self-pay | Admitting: Internal Medicine

## 2022-10-28 VITALS — BP 132/84 | HR 97 | Ht 62.0 in | Wt 153.5 lb

## 2022-10-28 DIAGNOSIS — R0789 Other chest pain: Secondary | ICD-10-CM | POA: Diagnosis not present

## 2022-10-28 DIAGNOSIS — Z8249 Family history of ischemic heart disease and other diseases of the circulatory system: Secondary | ICD-10-CM | POA: Insufficient documentation

## 2022-10-28 DIAGNOSIS — E7800 Pure hypercholesterolemia, unspecified: Secondary | ICD-10-CM

## 2022-10-28 NOTE — Progress Notes (Addendum)
Cardiology Office Note  Date: 10/28/2022   ID: Tammy Blankenship, DOB 03/12/1971, MRN [id number]  PCP:  Billie Lade, MD  Cardiologist:  Marjo Bicker, MD Electrophysiologist:  None   History of Present Illness: Tammy Blankenship is a 52 y.o. female known to have vitamin D deficiency was referred to cardiology clinic for evaluation of palpitations and chest pain.   Patient had palpitations for which she was referred to cardiology clinic in 2021. She underwent event monitor that was essentially unremarkable except for 2 brief episodes of SVT.  She did not have any palpitations since 2021 until recently.  She also noticed that her palpitations occur around the time of her menstrual cycles.  Interval between 2 menstrual cycles was between 5 and 6 months.  These palpitations eventually resolve after the menses.  She also had chest pain consistently for 1 week prior to ER visit on 09/17/2022.  EKG showed no ischemia, NSR.  Troponins were within normal limits, 1 set was obtained.  No recurrence of chest pain since the ER visit.  She also reported having some chest pains when she has palpitations.  This chest pain certainly positional, occurred at rest, especially at night and improves with positional changes.  No chest pain with exertion, she is active at baseline, does yard work and exercises with no symptoms of chest pain or DOE.  She has a family history of CAD, her brother had 7 stents.  Past Medical History:  Diagnosis Date   Fibroid 09/22/2015   History of ovarian cyst 09/15/2015   Hypothyroidism    PCO (polycystic ovaries)    Vitamin D deficiency 04/07/2018   Take 5000 IU daily    Past Surgical History:  Procedure Laterality Date   FLEXIBLE SIGMOIDOSCOPY  12/14/2010   Procedure: FLEXIBLE SIGMOIDOSCOPY;  Surgeon: Arlyce Harman, MD;  [facility];  Service: Endoscopy;  Laterality: N/A;  8:30   TONSILLECTOMY      Current Outpatient Medications  Medication Sig Dispense  Refill   cholecalciferol (VITAMIN D3) 25 MCG (1000 UNIT) tablet Take 1,000 Units by mouth daily.     No current facility-administered medications for this visit.   Allergies:  Patient has no known allergies.   Social History: Never smoked  Family History: The patient's family history includes Atrial fibrillation in her mother; Breast cancer (age of onset: 30) in her mother; Colon cancer in her maternal uncle; Dementia in her father; Diabetes in her sister; Parkinson's disease in her father; Rectal cancer in her maternal uncle.   ROS:  Please see the history of present illness. Otherwise, complete review of systems is positive for none  All other systems are reviewed and negative.   Physical Exam: VS:  BP 132/84 (BP Location: Left Arm, Patient Position: Sitting, Cuff Size: Normal)   Pulse 97   Ht 5\' 2"  (1.575 m)   Wt 153 lb 8 oz (69.6 kg)   SpO2 97%   BMI 28.08 kg/m , BMI Body mass index is 28.08 kg/m.  Wt Readings from Last 3 Encounters:  10/28/22 153 lb 8 oz (69.6 kg)  09/23/22 155 lb (70.3 kg)  11/12/21 170 lb (77.1 kg)    General: Patient appears comfortable at rest. HEENT: Conjunctiva and lids normal, oropharynx clear with moist mucosa. Neck: Supple, no elevated JVP or carotid bruits, no thyromegaly. Lungs: Clear to auscultation, nonlabored breathing at rest. Cardiac: Regular rate and rhythm, no S3 or significant systolic murmur, no pericardial rub. Abdomen: Soft,  nontender, no hepatomegaly, bowel sounds present, no guarding or rebound. Extremities: No pitting edema, distal pulses 2+. Skin: Warm and dry. Musculoskeletal: No kyphosis. Neuropsychiatric: Alert and oriented x3, affect grossly appropriate.  Recent Labwork: 11/12/2021: ALT 9; AST 17; TSH 2.000 09/17/2022: B Natriuretic Peptide 41.5; BUN 13; Creatinine, Ser 0.69; Hemoglobin 14.7; Magnesium 1.9; Platelets 228; Potassium 3.8; Sodium 135     Component Value Date/Time   CHOL 217 (H) 11/12/2021 1148   TRIG 97  11/12/2021 1148   HDL 58 11/12/2021 1148   CHOLHDL 3.7 11/12/2021 1148   LDLCALC 142 (H) 11/12/2021 1148    Assessment and Plan:   Palpitations: Sporadic, occurs around the time of her menstrual cycles. Going through menopause, infrequent menses.  Interval between 2 menstrual cycles is nearly between 5 and 6 months.  No indication of event monitor at this time.  Noncardiac chest pain, positional: Chest pain occurs mainly at rest, especially at night, which resolves with changing the position.  She is physically active at baseline, has more than 4 METS, no angina with exercise or performing yard work.  No indication of ischemia evaluation at this moment.  Family history of CAD: Patient's brother had 7 stents.  Obtain lipid panel, lipoprotein a and CT calcium scoring of coronaries.  LDL 142 in 10/2021.      Medication Adjustments/Labs and Tests Ordered: Current medicines are reviewed at length with the patient today.  Concerns regarding medicines are outlined above.    Disposition:  Follow up  pending results  Signed Carlyon Nolasco Verne Spurr, MD, 10/28/2022 3:24 PM    [facility] [address] [place], [place], [place] [address]

## 2022-10-28 NOTE — Patient Instructions (Addendum)
Medication Instructions:  Your physician recommends that you continue on your current medications as directed. Please refer to the Current Medication list given to you today.  Labwork: Lipid Panel & Lipoprotein A [facility] ([address]. [place])  Testing/Procedures: Cardiac Calcium Score CT  Follow-Up: Your physician recommends that you schedule a follow-up appointment in: pending   Any Other Special Instructions Will Be Listed Below (If Applicable).  If you need a refill on your cardiac medications before your next appointment, please call your pharmacy.

## 2022-10-31 ENCOUNTER — Encounter: Payer: Self-pay | Admitting: Internal Medicine

## 2022-10-31 ENCOUNTER — Ambulatory Visit: Payer: Commercial Managed Care - PPO | Admitting: Internal Medicine

## 2022-10-31 ENCOUNTER — Encounter: Payer: Self-pay | Admitting: *Deleted

## 2022-10-31 VITALS — BP 128/85 | HR 90 | Ht 62.0 in | Wt 152.0 lb

## 2022-10-31 DIAGNOSIS — Z131 Encounter for screening for diabetes mellitus: Secondary | ICD-10-CM

## 2022-10-31 DIAGNOSIS — Z0001 Encounter for general adult medical examination with abnormal findings: Secondary | ICD-10-CM | POA: Diagnosis not present

## 2022-10-31 DIAGNOSIS — Z1159 Encounter for screening for other viral diseases: Secondary | ICD-10-CM | POA: Diagnosis not present

## 2022-10-31 DIAGNOSIS — Z8639 Personal history of other endocrine, nutritional and metabolic disease: Secondary | ICD-10-CM | POA: Diagnosis not present

## 2022-10-31 DIAGNOSIS — Z114 Encounter for screening for human immunodeficiency virus [HIV]: Secondary | ICD-10-CM

## 2022-10-31 DIAGNOSIS — E559 Vitamin D deficiency, unspecified: Secondary | ICD-10-CM | POA: Diagnosis not present

## 2022-10-31 DIAGNOSIS — Z8249 Family history of ischemic heart disease and other diseases of the circulatory system: Secondary | ICD-10-CM

## 2022-10-31 DIAGNOSIS — Z1211 Encounter for screening for malignant neoplasm of colon: Secondary | ICD-10-CM | POA: Insufficient documentation

## 2022-10-31 DIAGNOSIS — E039 Hypothyroidism, unspecified: Secondary | ICD-10-CM | POA: Diagnosis not present

## 2022-10-31 NOTE — Assessment & Plan Note (Signed)
Previously documented history of vitamin D deficiency.  She is currently on daily vitamin D supplementation.  Repeat vitamin D level ordered today.

## 2022-10-31 NOTE — Progress Notes (Signed)
New Patient Office Visit  Subjective    Patient ID: Tammy Blankenship, female    DOB: January 08, 1971  Age: 52 y.o. MRN: [id number]  CC:  Chief Complaint  Patient presents with   Establish Care    HPI Tammy Blankenship presents to establish care and for annual well exam.  She is a 52 year old woman who endorses a past medical history significant for vitamin D deficiency, hypothyroidism, and anxiety.  Previously followed by Tammy Pizza, MD and is also followed at [facility].  [name] reports feeling well today.  She is asymptomatic currently and has no acute concerns to discuss aside from desiring to establish care.  She currently works as a Buyer, retail with [employer].  She denies tobacco, alcohol, and illicit drug use.  Her family medical history is significant for early CAD, Parkinson's disease, and dementia.  Chronic medical conditions and outstanding preventative care items discussed today are individually addressed A/P below.   Outpatient Encounter Medications as of 10/31/2022  Medication Sig   cholecalciferol (VITAMIN D3) 25 MCG (1000 UNIT) tablet Take 1,000 Units by mouth daily.   No facility-administered encounter medications on file as of 10/31/2022.    Past Medical History:  Diagnosis Date   Fibroid 09/22/2015   History of ovarian cyst 09/15/2015   Hypothyroidism    PCO (polycystic ovaries)    Vitamin D deficiency 04/07/2018   Take 5000 IU daily    Past Surgical History:  Procedure Laterality Date   FLEXIBLE SIGMOIDOSCOPY  12/14/2010   Procedure: FLEXIBLE SIGMOIDOSCOPY;  Surgeon: Arlyce Harman, MD;  [facility];  Service: Endoscopy;  Laterality: N/A;  8:30   TONSILLECTOMY      Family History  Problem Relation Age of Onset   Breast cancer Mother 6   Atrial fibrillation Mother    Parkinson's disease Father    Dementia Father    Diabetes Sister    Rectal cancer Maternal Uncle    Colon cancer Maternal Uncle        3 uncles    Social  History   Socioeconomic History   Marital status: Single    Spouse name: Not on file   Number of children: Not on file   Years of education: Not on file   Highest education level: Not on file  Occupational History   Not on file  Tobacco Use   Smoking status: Former    Types: Cigarettes   Smokeless tobacco: Never   Tobacco comments:    3-4 cigarttes a day  Vaping Use   Vaping status: Never Used  Substance and Sexual Activity   Alcohol use: Yes    Comment: evey other month   Drug use: No   Sexual activity: Not Currently    Birth control/protection: None, Abstinence  Other Topics Concern   Not on file  Social History Narrative   One kid-13. Has a long term partner: Tammy Blankenship. Works for [employer].   Social Determinants of Health   Financial Resource Strain: Low Risk  (11/12/2021)   Overall Financial Resource Strain (CARDIA)    Difficulty of Paying Living Expenses: Not very hard  Food Insecurity: No Food Insecurity (11/12/2021)   Hunger Vital Sign    Worried About Running Out of Food in the Last Year: Never true    Ran Out of Food in the Last Year: Never true  Transportation Needs: No Transportation Needs (11/12/2021)   PRAPARE - Administrator, Civil Service (Medical): No  Lack of Transportation (Non-Medical): No  Physical Activity: Sufficiently Active (11/12/2021)   Exercise Vital Sign    Days of Exercise per Week: 6 days    Minutes of Exercise per Session: 40 min  Stress: No Stress Concern Present (11/12/2021)   [employer] of Occupational Health - Occupational Stress Questionnaire    Feeling of Stress : Only a little  Social Connections: Socially Isolated (11/12/2021)   Social Connection and Isolation Panel [NHANES]    Frequency of Communication with Friends and Family: Twice a week    Frequency of Social Gatherings with Friends and Family: Once a week    Attends Religious Services: Never    Database administrator or Organizations: No    Attends Tax inspector Meetings: Never    Marital Status: Never married  Intimate Partner Violence: Not At Risk (11/12/2021)   Humiliation, Afraid, Rape, and Kick questionnaire    Fear of Current or Ex-Partner: No    Emotionally Abused: No    Physically Abused: No    Sexually Abused: No   Review of Systems  Constitutional:  Negative for chills and fever.  HENT:  Negative for sore throat.   Respiratory:  Negative for cough and shortness of breath.   Cardiovascular:  Negative for chest pain, palpitations and leg swelling.  Gastrointestinal:  Negative for abdominal pain, blood in stool, constipation, diarrhea, nausea and vomiting.  Genitourinary:  Negative for dysuria and hematuria.  Musculoskeletal:  Negative for myalgias.  Skin:  Negative for itching and rash.  Neurological:  Negative for dizziness and headaches.  Psychiatric/Behavioral:  Negative for depression and suicidal ideas.    Objective    BP 128/85   Pulse 90   Ht 5\' 2"  (1.575 m)   Wt 152 lb (68.9 kg)   SpO2 96%   BMI 27.80 kg/m   Physical Exam Vitals reviewed.  Constitutional:      General: She is not in acute distress.    Appearance: Normal appearance. She is not toxic-appearing.  HENT:     Head: Normocephalic and atraumatic.     Right Ear: External ear normal.     Left Ear: External ear normal.     Nose: Nose normal. No congestion or rhinorrhea.     Mouth/Throat:     Mouth: Mucous membranes are moist.     Pharynx: Oropharynx is clear. No oropharyngeal exudate or posterior oropharyngeal erythema.  Eyes:     General: No scleral icterus.    Extraocular Movements: Extraocular movements intact.     Conjunctiva/sclera: Conjunctivae normal.     Pupils: Pupils are equal, round, and reactive to light.  Cardiovascular:     Rate and Rhythm: Normal rate and regular rhythm.     Pulses: Normal pulses.     Heart sounds: Normal heart sounds. No murmur heard.    No friction rub. No gallop.  Pulmonary:     Effort: Pulmonary  effort is normal.     Breath sounds: Normal breath sounds. No wheezing, rhonchi or rales.  Abdominal:     General: Abdomen is flat. Bowel sounds are normal. There is no distension.     Palpations: Abdomen is soft.     Tenderness: There is no abdominal tenderness.  Musculoskeletal:        General: No swelling. Normal range of motion.     Cervical back: Normal range of motion.     Right lower leg: No edema.     Left lower leg: No edema.  Lymphadenopathy:  Cervical: No cervical adenopathy.  Skin:    General: Skin is warm and dry.     Capillary Refill: Capillary refill takes less than 2 seconds.     Coloration: Skin is not jaundiced.  Neurological:     General: No focal deficit present.     Mental Status: She is alert and oriented to person, place, and time.  Psychiatric:        Mood and Affect: Mood normal.        Behavior: Behavior normal.   Last CBC Lab Results  Component Value Date   WBC 6.7 09/17/2022   HGB 14.7 09/17/2022   HCT 45.2 09/17/2022   MCV 90.6 09/17/2022   MCH 29.5 09/17/2022   RDW 13.1 09/17/2022   PLT 228 09/17/2022   Last metabolic panel Lab Results  Component Value Date   GLUCOSE 110 (H) 09/17/2022   NA 135 09/17/2022   K 3.8 09/17/2022   CL 102 09/17/2022   CO2 23 09/17/2022   BUN 13 09/17/2022   CREATININE 0.69 09/17/2022   GFRNONAA >60 09/17/2022   CALCIUM 8.9 09/17/2022   PROT 6.9 11/12/2021   ALBUMIN 4.2 11/12/2021   LABGLOB 2.7 11/12/2021   AGRATIO 1.6 11/12/2021   BILITOT 0.4 11/12/2021   ALKPHOS 63 11/12/2021   AST 17 11/12/2021   ALT 9 11/12/2021   ANIONGAP 10 09/17/2022   Last lipids Lab Results  Component Value Date   CHOL 217 (H) 11/12/2021   HDL 58 11/12/2021   LDLCALC 142 (H) 11/12/2021   TRIG 97 11/12/2021   CHOLHDL 3.7 11/12/2021   Last hemoglobin A1c Lab Results  Component Value Date   HGBA1C 5.2 07/21/2019   Last thyroid functions Lab Results  Component Value Date   TSH 2.000 11/12/2021   Last vitamin  D Lab Results  Component Value Date   VD25OH 35.5 09/14/2020   Assessment & Plan:   Problem List Items Addressed This Visit       Hypothyroidism    She endorses a history of hypothyroidism, not currently on thyroid hormone replacement therapy.  Repeat thyroid studies ordered today.      Family history of coronary artery disease (Chronic)    Her family medical history is significant for early CAD.  She has recently established care with cardiology.  CT coronary calcium pending as well as lipid panel and lipoprotein a.      Vitamin D deficiency    Previously documented history of vitamin D deficiency.  She is currently on daily vitamin D supplementation.  Repeat vitamin D level ordered today.      Colon cancer screening    Gastroenterology referral placed today for screening colonoscopy.      Encounter for well adult exam with abnormal findings    Presenting today to establish care and for annual exam.  Previous records and labs have been reviewed. -Repeat labs ordered today, including one-time HIV/HIV screenings -Gastroenterology referral placed today for screening colonoscopy -She will receive her influenza vaccine this fall through work -Remaining preventative care items are up-to-date -We will tentatively plan for follow-up in 1 year for annual exam      Return in about 1 year (around 10/31/2023) for CPE.   Billie Lade, MD

## 2022-10-31 NOTE — Assessment & Plan Note (Signed)
Presenting today to establish care and for annual exam.  Previous records and labs have been reviewed. -Repeat labs ordered today, including one-time HIV/HIV screenings -Gastroenterology referral placed today for screening colonoscopy -She will receive her influenza vaccine this fall through work -Remaining preventative care items are up-to-date -We will tentatively plan for follow-up in 1 year for annual exam

## 2022-10-31 NOTE — Patient Instructions (Signed)
It was a pleasure to see you today.  Thank you for giving [place] the opportunity to be involved in your care.  Below is a brief recap of your visit and next steps.  We will plan to see you again in 1 year.   Summary You have established care today Annual exam completed We will check basic labs GI referral placed Follow up in 1 year for annual exam

## 2022-10-31 NOTE — Assessment & Plan Note (Signed)
She endorses a history of hypothyroidism, not currently on thyroid hormone replacement therapy.  Repeat thyroid studies ordered today.

## 2022-10-31 NOTE — Assessment & Plan Note (Signed)
Her family medical history is significant for early CAD.  She has recently established care with cardiology.  CT coronary calcium pending as well as lipid panel and lipoprotein a.

## 2022-10-31 NOTE — Assessment & Plan Note (Addendum)
Gastroenterology referral placed today for screening colonoscopy. 

## 2022-11-01 ENCOUNTER — Other Ambulatory Visit (HOSPITAL_COMMUNITY): Payer: Self-pay

## 2022-11-01 ENCOUNTER — Telehealth: Payer: Self-pay

## 2022-11-01 LAB — LIPOPROTEIN A (LPA): Lipoprotein (a): 155.6 nmol/L — ABNORMAL HIGH (ref <75.0)

## 2022-11-01 MED ORDER — ROSUVASTATIN CALCIUM 20 MG PO TABS
20.0000 mg | ORAL_TABLET | Freq: Every day | ORAL | 1 refills | Status: DC
  Filled 2022-11-01: qty 90, 90d supply, fill #0

## 2022-11-01 NOTE — Telephone Encounter (Signed)
Patient informed, verbalized understanding and sent PCP copy

## 2022-11-01 NOTE — Telephone Encounter (Signed)
-----   Message from Vishnu P Mallipeddi sent at 11/01/2022 12:45 PM EDT ----- Lipoprotein a levels are significantly elevated, 155 (less than 75 is normal).  Lipid panel reviewed, LDL 128.  Start rosuvastatin 20 mg nightly.  Side effect includes myalgias.  If she experiences myalgias, will need to decrease the dose of rosuvastatin.

## 2022-11-02 ENCOUNTER — Other Ambulatory Visit (HOSPITAL_COMMUNITY): Payer: Self-pay

## 2022-11-02 LAB — HEMOGLOBIN A1C
Est. average glucose Bld gHb Est-mCnc: 108 mg/dL
Hgb A1c MFr Bld: 5.4 % (ref 4.8–5.6)

## 2022-11-02 LAB — HCV INTERPRETATION

## 2022-11-02 LAB — B12 AND FOLATE PANEL
Folate: 14.9 ng/mL (ref >3.0)
Vitamin B-12: 227 pg/mL — ABNORMAL LOW (ref 232–1245)

## 2022-11-02 LAB — HIV ANTIBODY (ROUTINE TESTING W REFLEX): HIV Screen 4th Generation wRfx: NONREACTIVE

## 2022-11-02 LAB — TSH+FREE T4
Free T4: 1.07 ng/dL (ref 0.82–1.77)
TSH: 3.2 u[IU]/mL (ref 0.450–4.500)

## 2022-11-02 LAB — VITAMIN D 25 HYDROXY (VIT D DEFICIENCY, FRACTURES): Vit D, 25-Hydroxy: 34.7 ng/mL (ref 30.0–100.0)

## 2022-11-02 LAB — HCV AB W REFLEX TO QUANT PCR: HCV Ab: NONREACTIVE

## 2022-11-08 ENCOUNTER — Encounter (INDEPENDENT_AMBULATORY_CARE_PROVIDER_SITE_OTHER): Payer: Commercial Managed Care - PPO | Admitting: Internal Medicine

## 2022-11-08 DIAGNOSIS — Z8249 Family history of ischemic heart disease and other diseases of the circulatory system: Secondary | ICD-10-CM | POA: Diagnosis not present

## 2022-11-08 DIAGNOSIS — E7849 Other hyperlipidemia: Secondary | ICD-10-CM

## 2022-11-12 ENCOUNTER — Encounter: Payer: Self-pay | Admitting: Internal Medicine

## 2022-11-15 NOTE — Telephone Encounter (Signed)
Please see the MyChart message reply(ies) for my assessment and plan.    This patient gave consent for this Medical Advice Message and is aware that it may result in a bill to their insurance company, as well as the possibility of receiving a bill for a co-payment or deductible. They are an established patient, but are not seeking medical advice exclusively about a problem treated during an in person or video visit in the last seven days. I did not recommend an in person or video visit within seven days of my reply.    I spent a total of 5 minutes cumulative time within 7 days through MyChart messaging.  Vishnu P Mallipeddi, MD   

## 2022-11-28 ENCOUNTER — Ambulatory Visit (INDEPENDENT_AMBULATORY_CARE_PROVIDER_SITE_OTHER): Payer: Commercial Managed Care - PPO | Admitting: Adult Health

## 2022-11-28 ENCOUNTER — Other Ambulatory Visit (HOSPITAL_COMMUNITY)
Admission: RE | Admit: 2022-11-28 | Discharge: 2022-11-28 | Disposition: A | Discharge status: Home / Self Care | Payer: Commercial Managed Care - PPO | Source: Ambulatory Visit | Attending: Adult Health | Admitting: Adult Health

## 2022-11-28 ENCOUNTER — Telehealth: Payer: Self-pay | Admitting: Internal Medicine

## 2022-11-28 ENCOUNTER — Ambulatory Visit (HOSPITAL_COMMUNITY)
Admission: RE | Admit: 2022-11-28 | Discharge: 2022-11-28 | Disposition: A | Discharge status: Home / Self Care | Payer: Commercial Managed Care - PPO | Source: Ambulatory Visit | Attending: Adult Health | Admitting: Adult Health

## 2022-11-28 ENCOUNTER — Encounter: Payer: Self-pay | Admitting: Adult Health

## 2022-11-28 ENCOUNTER — Encounter (HOSPITAL_COMMUNITY): Payer: Self-pay

## 2022-11-28 VITALS — BP 134/82 | HR 87 | Ht 62.0 in | Wt 149.0 lb

## 2022-11-28 DIAGNOSIS — N951 Menopausal and female climacteric states: Secondary | ICD-10-CM | POA: Diagnosis not present

## 2022-11-28 DIAGNOSIS — Z01419 Encounter for gynecological examination (general) (routine) without abnormal findings: Secondary | ICD-10-CM | POA: Diagnosis not present

## 2022-11-28 DIAGNOSIS — Z1231 Encounter for screening mammogram for malignant neoplasm of breast: Secondary | ICD-10-CM | POA: Insufficient documentation

## 2022-11-28 DIAGNOSIS — N926 Irregular menstruation, unspecified: Secondary | ICD-10-CM

## 2022-11-28 NOTE — Telephone Encounter (Signed)
Pt called in regard to last visit with provider , states that chart notes state that she discussed that she had stopped smoking and that was not discussed.  Wants to know if that can be taken out of chart info   Wants a call back in regard

## 2022-11-28 NOTE — Progress Notes (Signed)
Patient ID: Tammy Blankenship, female   DOB: Apr 18, 1970, 52 y.o.   MRN: [id number] History of Present Illness: Tammy Blankenship is a 52 year old white female,single, G0P0, in for a well woman gyn exam and she requests a pap. 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   PCP is Dr Durwin Nora   Current Medications, Allergies, Past Medical History, Past Surgical History, Family History and Social History were reviewed in [employer] [facility] electronic medical record.     Review of Systems: Patient denies any headaches, hearing loss, fatigue, blurred vision, shortness of breath, chest pain, abdominal pain, problems with bowel movements, urination, or intercourse(not active). No joint pain or mood swings.  Periods are irregular, skipped February til July then has been had July-September    Physical Exam:BP 134/82 (BP Location: Left Arm, Patient Position: Sitting, Cuff Size: Normal)   Pulse 87   Ht 5\' 2"  (1.575 m)   Wt 149 lb (67.6 kg)   LMP 11/26/2022   BMI 27.25 kg/m  has lost 6 lbs since July  General:  Well developed, well nourished, no acute distress Skin:  Warm and dry Neck:  Midline trachea, normal thyroid, good ROM, no lymphadenopathy Lungs; Clear to auscultation bilaterally Breast:  No dominant palpable mass, retraction, or nipple discharge Cardiovascular: Regular rate and rhythm Abdomen:  Soft, non tender, no hepatosplenomegaly Pelvic:  External genitalia is normal in appearance, no lesions.  The vagina is normal in appearance, +blood in vault. Urethra has no lesions or masses. The cervix is smooth, pap with HR HPV genotyping performed.  Uterus is felt to be normal size, shape, and contour.  No adnexal masses or tenderness noted.Bladder is non tender, no masses felt. Rectal: Deferred on period Extremities/musculoskeletal:  No swelling or varicosities noted, no clubbing or cyanosis Psych:  No mood changes, alert and cooperative,seems happy AA is 1 Fall risk is low    11/28/2022    8:46 AM 10/31/2022    11:08 AM 11/12/2021   10:50 AM  Depression screen PHQ 2/9  Decreased Interest 0 0 0  Down, Depressed, Hopeless 0 1 0  PHQ - 2 Score 0 1 0  Altered sleeping 1 0 2  Tired, decreased energy 0 0 1  Change in appetite 0 0 1  Feeling bad or failure about yourself  0 0 0  Trouble concentrating 1 0 2  Moving slowly or fidgety/restless 0 0 0  Suicidal thoughts 0 0 0  PHQ-9 Score 2 1 6        11/28/2022    8:47 AM 10/31/2022   11:08 AM 11/12/2021   10:50 AM 09/14/2020    1:39 PM  GAD 7 : Generalized Anxiety Score  Nervous, Anxious, on Edge 0 1 1 0  Control/stop worrying 0 0 0 0  Worry too much - different things 0 0 1 0  Trouble relaxing 0 0 1 0  Restless 0 0 0 0  Easily annoyed or irritable 1 1 1 1   Afraid - awful might happen 0 0 0 0  Total GAD 7 Score 1 2 4 1       Upstream - 11/28/22 0840       Pregnancy Intention Screening   Does the patient want to become pregnant in the next year? N/A    Does the patient's partner want to become pregnant in the next year? N/A    Would the patient like to discuss contraceptive options today? N/A      Contraception Wrap Up   Current  Method Abstinence    End Method Abstinence    Contraception Counseling Provided No            Examination chaperoned by Malachy Mood LPN   Impression and Plan: 1. Encounter for gynecological examination with Papanicolaou smear of cervix Pap sent Pap in 3 years if normal Physical in 1 year Mammogram is today To get colonoscopy in future  Has seen cardiology and had labs, getting CT Cardiac scoring 12/13/22   - Cytology - PAP( [place])  2. Irregular periods  3. Peri-menopause

## 2022-11-28 NOTE — Telephone Encounter (Signed)
Called pt and pt did not answer. Unable to leave vm.

## 2022-11-28 NOTE — Telephone Encounter (Signed)
Social History: The patient  reports that she has quit smoking. Her smoking use included cigarettes. She has never used smokeless tobacco.    Pt came into office stating she's never been a smoker and this is incorrect, found this after her visit on her mychart. Pt would like this removed from her chart and from her history if it is there.  Please let pt know if there's anything else she needs to do.

## 2022-11-29 ENCOUNTER — Encounter: Payer: Self-pay | Admitting: Internal Medicine

## 2022-11-29 ENCOUNTER — Encounter: Payer: Self-pay | Admitting: *Deleted

## 2022-11-29 NOTE — Telephone Encounter (Signed)
Replied via mychart.

## 2022-11-29 NOTE — Telephone Encounter (Signed)
lmtrc

## 2022-12-02 ENCOUNTER — Encounter: Payer: Self-pay | Admitting: *Deleted

## 2022-12-02 LAB — CYTOLOGY - PAP
Comment: NEGATIVE
Diagnosis: NEGATIVE
Diagnosis: REACTIVE
High risk HPV: NEGATIVE

## 2022-12-09 ENCOUNTER — Telehealth: Payer: Self-pay | Admitting: Internal Medicine

## 2022-12-09 NOTE — Telephone Encounter (Signed)
error 

## 2022-12-09 NOTE — Telephone Encounter (Signed)
Patient calling back saying still says former smoker needs it updated

## 2022-12-09 NOTE — Telephone Encounter (Signed)
Mychart message sent with link to get information changed

## 2022-12-11 ENCOUNTER — Other Ambulatory Visit (HOSPITAL_COMMUNITY): Payer: Commercial Managed Care - PPO

## 2022-12-13 ENCOUNTER — Other Ambulatory Visit (HOSPITAL_COMMUNITY): Payer: Commercial Managed Care - PPO

## 2023-01-17 ENCOUNTER — Ambulatory Visit (HOSPITAL_COMMUNITY)
Admission: RE | Admit: 2023-01-17 | Discharge: 2023-01-17 | Disposition: A | Discharge status: Home / Self Care | Payer: Self-pay | Source: Ambulatory Visit | Attending: Internal Medicine | Admitting: Internal Medicine

## 2023-01-17 DIAGNOSIS — Z8249 Family history of ischemic heart disease and other diseases of the circulatory system: Secondary | ICD-10-CM | POA: Insufficient documentation

## 2023-01-20 ENCOUNTER — Telehealth: Payer: Self-pay

## 2023-01-20 NOTE — Telephone Encounter (Signed)
-----   Message from Tammy Blankenship sent at 01/17/2023  4:42 PM EDT ----- Coronary calcium score is 0. No blockages in the heart arteries.

## 2023-01-20 NOTE — Telephone Encounter (Signed)
Left message and sent MyChart message

## 2023-01-28 ENCOUNTER — Other Ambulatory Visit: Payer: Self-pay | Admitting: Internal Medicine

## 2023-01-28 ENCOUNTER — Other Ambulatory Visit (HOSPITAL_COMMUNITY): Payer: Self-pay

## 2023-01-28 MED ORDER — ROSUVASTATIN CALCIUM 20 MG PO TABS
20.0000 mg | ORAL_TABLET | Freq: Every day | ORAL | 2 refills | Status: DC
  Filled 2023-01-28: qty 90, 90d supply, fill #0
  Filled 2023-05-05: qty 90, 90d supply, fill #1
  Filled 2023-07-17: qty 90, 90d supply, fill #2

## 2023-01-28 NOTE — Addendum Note (Signed)
Addended by: Carmelina Paddock on: 01/28/2023 03:15 PM   Modules accepted: Orders

## 2023-01-29 ENCOUNTER — Other Ambulatory Visit (HOSPITAL_COMMUNITY): Payer: Self-pay

## 2023-05-05 ENCOUNTER — Other Ambulatory Visit (HOSPITAL_COMMUNITY): Payer: Self-pay

## 2023-05-06 ENCOUNTER — Encounter (INDEPENDENT_AMBULATORY_CARE_PROVIDER_SITE_OTHER): Payer: Self-pay | Admitting: *Deleted

## 2023-07-17 ENCOUNTER — Other Ambulatory Visit (HOSPITAL_COMMUNITY): Payer: Self-pay

## 2023-08-20 DIAGNOSIS — M545 Low back pain, unspecified: Secondary | ICD-10-CM | POA: Diagnosis not present

## 2023-09-02 DIAGNOSIS — H5213 Myopia, bilateral: Secondary | ICD-10-CM | POA: Diagnosis not present

## 2023-09-10 DIAGNOSIS — M545 Low back pain, unspecified: Secondary | ICD-10-CM | POA: Diagnosis not present

## 2023-11-04 ENCOUNTER — Encounter: Payer: Self-pay | Admitting: Internal Medicine

## 2023-11-04 ENCOUNTER — Ambulatory Visit: Payer: Self-pay | Admitting: Internal Medicine

## 2023-11-04 VITALS — BP 136/82 | HR 80 | Ht 62.0 in | Wt 161.6 lb

## 2023-11-04 DIAGNOSIS — R030 Elevated blood-pressure reading, without diagnosis of hypertension: Secondary | ICD-10-CM

## 2023-11-04 DIAGNOSIS — R7989 Other specified abnormal findings of blood chemistry: Secondary | ICD-10-CM | POA: Diagnosis not present

## 2023-11-04 DIAGNOSIS — Z0001 Encounter for general adult medical examination with abnormal findings: Secondary | ICD-10-CM | POA: Diagnosis not present

## 2023-11-04 DIAGNOSIS — Z1211 Encounter for screening for malignant neoplasm of colon: Secondary | ICD-10-CM

## 2023-11-04 DIAGNOSIS — E782 Mixed hyperlipidemia: Secondary | ICD-10-CM | POA: Diagnosis not present

## 2023-11-04 DIAGNOSIS — R739 Hyperglycemia, unspecified: Secondary | ICD-10-CM

## 2023-11-04 DIAGNOSIS — R002 Palpitations: Secondary | ICD-10-CM | POA: Diagnosis not present

## 2023-11-04 DIAGNOSIS — E559 Vitamin D deficiency, unspecified: Secondary | ICD-10-CM

## 2023-11-04 NOTE — Progress Notes (Signed)
 Established Patient Office Visit  Subjective:  Patient ID: Tammy Blankenship, female    DOB: Jun 09, 1970  Age: 53 y.o. MRN: [id number]  CC:  Chief Complaint  Patient presents with   Annual Exam    Cpe.     HPI Tammy Blankenship is a 53 y.o. female with past medical history of HLD who presents for annual physical. She was seen by Dr. Melvenia previously.  She takes Crestor  10 mg QOD for HLD.  Her LDL was 128 and had elevated lipoprotein a in 08/25.  She was evaluated by cardiologist for palpitations, had cardiac monitor evaluation and CT coronary calcium  score, which were unremarkable.  She works as Buyer, retail at [employer].  Past Medical History:  Diagnosis Date   Fibroid 09/22/2015   History of ovarian cyst 09/15/2015   Hypothyroidism    PCO (polycystic ovaries)    Vitamin B12 deficiency    Vitamin D  deficiency 04/07/2018   Take 5000 IU daily    Past Surgical History:  Procedure Laterality Date   FLEXIBLE SIGMOIDOSCOPY  12/14/2010   Procedure: FLEXIBLE SIGMOIDOSCOPY;  Surgeon: Margo CHRISTELLA Haddock, MD;  [facility];  Service: Endoscopy;  Laterality: N/A;  8:30   TONSILLECTOMY      Family History  Problem Relation Age of Onset   Breast cancer Mother 19   Atrial fibrillation Mother    Parkinson's disease Father    Dementia Father    Diabetes Sister    Rectal cancer Maternal Uncle    Colon cancer Maternal Uncle        3 uncles    Social History   Socioeconomic History   Marital status: Single    Spouse name: Not on file   Number of children: Not on file   Years of education: Not on file   Highest education level: Not on file  Occupational History   Not on file  Tobacco Use   Smoking status: Never    Passive exposure: Never   Smokeless tobacco: Never   Tobacco comments:    12/02/2022 - PATIENT STATES SHE HAS NEVER BEEN A SMOKER   --agh   Vaping Use   Vaping status: Never Used  Substance and Sexual Activity   Alcohol use: Yes     Comment: evey other month   Drug use: No   Sexual activity: Not Currently    Birth control/protection: None, Abstinence  Other Topics Concern   Not on file  Social History Narrative   One kid-13. Has a long term partner: Briant. Works for [employer].   Social Drivers of Corporate investment banker Strain: Low Risk  (11/28/2022)   Overall Financial Resource Strain (CARDIA)    Difficulty of Paying Living Expenses: Not very hard  Food Insecurity: No Food Insecurity (11/28/2022)   Hunger Vital Sign    Worried About Running Out of Food in the Last Year: Never true    Ran Out of Food in the Last Year: Never true  Transportation Needs: No Transportation Needs (11/28/2022)   PRAPARE - Administrator, Civil Service (Medical): No    Lack of Transportation (Non-Medical): No  Physical Activity: Sufficiently Active (11/28/2022)   Exercise Vital Sign    Days of Exercise per Week: 6 days    Minutes of Exercise per Session: 40 min  Stress: No Stress Concern Present (11/28/2022)   [employer] of Occupational Health - Occupational Stress Questionnaire    Feeling of Stress : Only a  little  Social Connections: Socially Isolated (11/28/2022)   Social Connection and Isolation Panel    Frequency of Communication with Friends and Family: Twice a week    Frequency of Social Gatherings with Friends and Family: Once a week    Attends Religious Services: Never    Database administrator or Organizations: No    Attends Banker Meetings: Never    Marital Status: Never married  Intimate Partner Violence: Not At Risk (11/28/2022)   Humiliation, Afraid, Rape, and Kick questionnaire    Fear of Current or Ex-Partner: No    Emotionally Abused: No    Physically Abused: No    Sexually Abused: No    Outpatient Medications Prior to Visit  Medication Sig Dispense Refill   Multiple Vitamin (MULTIVITAMIN) tablet Take 1 tablet by mouth daily.     rosuvastatin  (CRESTOR ) 20 MG tablet Take 1 tablet  (20 mg total) by mouth daily. (Patient taking differently: Take 10 mg by mouth daily. Every other day) 90 tablet 2   No facility-administered medications prior to visit.    No Known Allergies  ROS Review of Systems  Constitutional:  Negative for chills and fever.  HENT:  Negative for congestion, sinus pressure, sinus pain and sore throat.   Eyes:  Negative for pain and discharge.  Respiratory:  Negative for cough and shortness of breath.   Cardiovascular:  Positive for palpitations (Intermittent). Negative for chest pain.  Gastrointestinal:  Negative for abdominal pain, diarrhea, nausea and vomiting.  Endocrine: Negative for polydipsia and polyuria.  Genitourinary:  Negative for dysuria and hematuria.  Musculoskeletal:  Negative for neck pain and neck stiffness.  Skin:  Negative for rash.  Neurological:  Negative for dizziness and weakness.  Psychiatric/Behavioral:  Negative for agitation and behavioral problems.       Objective:    Physical Exam Vitals reviewed.  Constitutional:      General: She is not in acute distress.    Appearance: She is not diaphoretic.  HENT:     Head: Normocephalic and atraumatic.     Nose: Nose normal. No congestion.     Mouth/Throat:     Mouth: Mucous membranes are moist.     Pharynx: No posterior oropharyngeal erythema.  Eyes:     General: No scleral icterus.    Extraocular Movements: Extraocular movements intact.  Cardiovascular:     Rate and Rhythm: Normal rate and regular rhythm.     Heart sounds: Normal heart sounds. No murmur heard. Pulmonary:     Breath sounds: Normal breath sounds. No wheezing or rales.  Abdominal:     Palpations: Abdomen is soft.     Tenderness: There is no abdominal tenderness.  Musculoskeletal:     Cervical back: Neck supple. No tenderness.     Right lower leg: No edema.     Left lower leg: No edema.  Skin:    General: Skin is warm.     Findings: No rash.  Neurological:     General: No focal deficit  present.     Mental Status: She is alert and oriented to person, place, and time.     Cranial Nerves: No cranial nerve deficit.     Sensory: No sensory deficit.     Motor: No weakness.  Psychiatric:        Mood and Affect: Mood normal.        Behavior: Behavior normal.     BP 136/82 (BP Location: Left Arm)   Pulse 80  Ht 5' 2 (1.575 m)   Wt 161 lb 9.6 oz (73.3 kg)   SpO2 94%   BMI 29.56 kg/m  Wt Readings from Last 3 Encounters:  11/04/23 161 lb 9.6 oz (73.3 kg)  11/28/22 149 lb (67.6 kg)  10/31/22 152 lb (68.9 kg)    Lab Results  Component Value Date   TSH 3.200 10/31/2022   Lab Results  Component Value Date   WBC 6.7 09/17/2022   HGB 14.7 09/17/2022   HCT 45.2 09/17/2022   MCV 90.6 09/17/2022   PLT 228 09/17/2022   Lab Results  Component Value Date   NA 135 09/17/2022   K 3.8 09/17/2022   CO2 23 09/17/2022   GLUCOSE 110 (H) 09/17/2022   BUN 13 09/17/2022   CREATININE 0.69 09/17/2022   BILITOT 0.4 11/12/2021   ALKPHOS 63 11/12/2021   AST 17 11/12/2021   ALT 9 11/12/2021   PROT 6.9 11/12/2021   ALBUMIN 4.2 11/12/2021   CALCIUM  8.9 09/17/2022   ANIONGAP 10 09/17/2022   EGFR 105 11/12/2021   Lab Results  Component Value Date   CHOL 214 (H) 10/31/2022   Lab Results  Component Value Date   HDL 71 10/31/2022   Lab Results  Component Value Date   LDLCALC 128 (H) 10/31/2022   Lab Results  Component Value Date   TRIG 83 10/31/2022   Lab Results  Component Value Date   CHOLHDL 3.0 10/31/2022   Lab Results  Component Value Date   HGBA1C 5.4 10/31/2022      Assessment & Plan:   Problem List Items Addressed This Visit       Other   Palpitations (Chronic)   Intermittent palpitations, she attributes it to perimenopausal changes Has had cardiology evaluation      Vitamin D  deficiency   Takes vitamin D  supplement Check Vitamin D  level      Relevant Orders   VITAMIN D  25 Hydroxy (Vit-D Deficiency, Fractures)   Colon cancer screening    Discussed about colonoscopy and cologuard - benefits of each procedure discussed. Patient prefers Cologuard - ordered.      Relevant Orders   Cologuard   Encounter for general adult medical examination with abnormal findings - Primary   Physical exam as documented. Counseling done  re healthy lifestyle involving commitment to 150 minutes exercise per week, heart healthy diet, and attaining healthy weight.The importance of adequate sleep also discussed. Immunization and cancer screening needs are specifically addressed at this visit.      Mixed hyperlipidemia   On Crestor  10 mg every other day for now Check lipid profile Continue to follow heart healthy diet      Relevant Orders   Lipid panel   CMP14+EGFR   Elevated BP without diagnosis of hypertension   BP was elevated initially, but improved later She has checked her BP at home, usually around 120s-130s/70s      Relevant Orders   CMP14+EGFR   CBC with Differential/Platelet   TSH + free T4   Other Visit Diagnoses       Elevated TSH       Relevant Orders   CBC with Differential/Platelet   TSH + free T4     Hyperglycemia       Relevant Orders   Hemoglobin A1c       No orders of the defined types were placed in this encounter.   Follow-up: Return in about 1 year (around 11/03/2024) for Annual physical.    Serenna Deroy K  Tobie, MD

## 2023-11-04 NOTE — Assessment & Plan Note (Signed)
 On Crestor  10 mg every other day for now Check lipid profile Continue to follow heart healthy diet

## 2023-11-04 NOTE — Assessment & Plan Note (Signed)
 Intermittent palpitations, she attributes it to perimenopausal changes Has had cardiology evaluation

## 2023-11-04 NOTE — Assessment & Plan Note (Signed)
 Discussed about colonoscopy and cologuard - benefits of each procedure discussed. Patient prefers Cologuard - ordered.

## 2023-11-04 NOTE — Assessment & Plan Note (Signed)
 Physical exam as documented. Counseling done  re healthy lifestyle involving commitment to 150 minutes exercise per week, heart healthy diet, and attaining healthy weight.The importance of adequate sleep also discussed. Immunization and cancer screening needs are specifically addressed at this visit.

## 2023-11-04 NOTE — Assessment & Plan Note (Addendum)
 Takes vitamin D  supplement Check Vitamin D  level

## 2023-11-04 NOTE — Patient Instructions (Signed)
 Please continue to take medications as prescribed.  Please continue to follow heart healthy diet and perform moderate exercise/walking at least 150 mins/week.  Please consider getting pneumococcal and Shingrix vaccine.

## 2023-11-04 NOTE — Assessment & Plan Note (Signed)
 BP was elevated initially, but improved later She has checked her BP at home, usually around 120s-130s/70s

## 2023-11-05 ENCOUNTER — Ambulatory Visit: Payer: Self-pay | Admitting: Internal Medicine

## 2023-11-05 ENCOUNTER — Other Ambulatory Visit (HOSPITAL_COMMUNITY): Payer: Self-pay | Admitting: Adult Health

## 2023-11-05 DIAGNOSIS — Z1231 Encounter for screening mammogram for malignant neoplasm of breast: Secondary | ICD-10-CM

## 2023-11-05 LAB — CBC WITH DIFFERENTIAL/PLATELET
Basophils Absolute: 0 x10E3/uL (ref 0.0–0.2)
Basos: 1 %
EOS (ABSOLUTE): 0.1 x10E3/uL (ref 0.0–0.4)
Eos: 2 %
Hematocrit: 45.2 % (ref 34.0–46.6)
Hemoglobin: 14.7 g/dL (ref 11.1–15.9)
Immature Grans (Abs): 0 x10E3/uL (ref 0.0–0.1)
Immature Granulocytes: 0 %
Lymphocytes Absolute: 1.5 x10E3/uL (ref 0.7–3.1)
Lymphs: 33 %
MCH: 30.4 pg (ref 26.6–33.0)
MCHC: 32.5 g/dL (ref 31.5–35.7)
MCV: 93 fL (ref 79–97)
Monocytes Absolute: 0.3 x10E3/uL (ref 0.1–0.9)
Monocytes: 7 %
Neutrophils Absolute: 2.5 x10E3/uL (ref 1.4–7.0)
Neutrophils: 57 %
Platelets: 209 x10E3/uL (ref 150–450)
RBC: 4.84 x10E6/uL (ref 3.77–5.28)
RDW: 12.9 % (ref 11.7–15.4)
WBC: 4.4 x10E3/uL (ref 3.4–10.8)

## 2023-11-05 LAB — CMP14+EGFR
ALT: 13 IU/L (ref 0–32)
AST: 21 IU/L (ref 0–40)
Albumin: 4.2 g/dL (ref 3.8–4.9)
Alkaline Phosphatase: 65 IU/L (ref 44–121)
BUN/Creatinine Ratio: 19 (ref 9–23)
BUN: 13 mg/dL (ref 6–24)
Bilirubin Total: 0.5 mg/dL (ref 0.0–1.2)
CO2: 23 mmol/L (ref 20–29)
Calcium: 9.6 mg/dL (ref 8.7–10.2)
Chloride: 104 mmol/L (ref 96–106)
Creatinine, Ser: 0.7 mg/dL (ref 0.57–1.00)
Globulin, Total: 2.3 g/dL (ref 1.5–4.5)
Glucose: 84 mg/dL (ref 70–99)
Potassium: 4.8 mmol/L (ref 3.5–5.2)
Sodium: 141 mmol/L (ref 134–144)
Total Protein: 6.5 g/dL (ref 6.0–8.5)
eGFR: 103 mL/min/1.73 (ref >59)

## 2023-11-05 LAB — LIPID PANEL
Chol/HDL Ratio: 2.1 ratio (ref 0.0–4.4)
Cholesterol, Total: 173 mg/dL (ref 100–199)
HDL: 83 mg/dL (ref >39)
LDL Chol Calc (NIH): 77 mg/dL (ref 0–99)
Triglycerides: 69 mg/dL (ref 0–149)
VLDL Cholesterol Cal: 13 mg/dL (ref 5–40)

## 2023-11-05 LAB — TSH+FREE T4
Free T4: 1.24 ng/dL (ref 0.82–1.77)
TSH: 2.8 u[IU]/mL (ref 0.450–4.500)

## 2023-11-05 LAB — HEMOGLOBIN A1C
Est. average glucose Bld gHb Est-mCnc: 105 mg/dL
Hgb A1c MFr Bld: 5.3 % (ref 4.8–5.6)

## 2023-11-05 LAB — VITAMIN D 25 HYDROXY (VIT D DEFICIENCY, FRACTURES): Vit D, 25-Hydroxy: 36.5 ng/mL (ref 30.0–100.0)

## 2023-11-26 DIAGNOSIS — Z1211 Encounter for screening for malignant neoplasm of colon: Secondary | ICD-10-CM | POA: Diagnosis not present

## 2023-12-01 ENCOUNTER — Encounter: Payer: Self-pay | Admitting: Internal Medicine

## 2023-12-03 ENCOUNTER — Encounter: Payer: Self-pay | Admitting: Internal Medicine

## 2023-12-04 LAB — COLOGUARD: COLOGUARD: NEGATIVE

## 2024-01-14 ENCOUNTER — Encounter (HOSPITAL_COMMUNITY): Payer: Self-pay

## 2024-01-14 ENCOUNTER — Encounter: Payer: Self-pay | Admitting: Adult Health

## 2024-01-14 ENCOUNTER — Ambulatory Visit (INDEPENDENT_AMBULATORY_CARE_PROVIDER_SITE_OTHER): Admitting: Adult Health

## 2024-01-14 ENCOUNTER — Ambulatory Visit (HOSPITAL_COMMUNITY)
Admission: RE | Admit: 2024-01-14 | Discharge: 2024-01-14 | Disposition: A | Discharge status: Home / Self Care | Source: Ambulatory Visit | Attending: Adult Health | Admitting: Adult Health

## 2024-01-14 VITALS — BP 132/93 | HR 80 | Ht 63.0 in | Wt 161.0 lb

## 2024-01-14 DIAGNOSIS — Z01419 Encounter for gynecological examination (general) (routine) without abnormal findings: Secondary | ICD-10-CM | POA: Diagnosis not present

## 2024-01-14 DIAGNOSIS — N951 Menopausal and female climacteric states: Secondary | ICD-10-CM | POA: Diagnosis not present

## 2024-01-14 DIAGNOSIS — Z1231 Encounter for screening mammogram for malignant neoplasm of breast: Secondary | ICD-10-CM | POA: Diagnosis not present

## 2024-01-14 DIAGNOSIS — N926 Irregular menstruation, unspecified: Secondary | ICD-10-CM

## 2024-01-14 DIAGNOSIS — R030 Elevated blood-pressure reading, without diagnosis of hypertension: Secondary | ICD-10-CM

## 2024-01-14 NOTE — Progress Notes (Signed)
 Patient ID: Tammy Blankenship, female   DOB: 25-Oct-1970, 53 y.o.   MRN: [id number] History of Present Illness: Tammy Blankenship is a 53 year old white female,single, G0P0, in for a well woman gyn exam. She is still exercising.  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     Component Value Date/Time   DIAGPAP  11/28/2022 0912    - Negative for Intraepithelial Lesions or Malignancy (NILM)   DIAGPAP - Benign reactive/reparative changes 11/28/2022 0912   DIAGPAP  11/12/2021 1052    - Negative for intraepithelial lesion or malignancy (NILM)   HPVHIGH Negative 11/28/2022 0912   HPVHIGH Negative 11/12/2021 1052   HPVHIGH Negative 09/14/2020 1341   ADEQPAP  11/28/2022 0912    Satisfactory but limited for evaluation with partially obscuring blood;   ADEQPAP transformation zone component present. 11/28/2022 0912   ADEQPAP  11/12/2021 1052    Satisfactory for evaluation; transformation zone component ABSENT.    PCP is Dr Tobie  Current Medications, Allergies, Past Medical History, Past Surgical History, Family History and Social History were reviewed in [employer] [facility] electronic medical record.     Review of Systems: Patient denies any headaches, hearing loss, fatigue, blurred vision, shortness of breath, chest pain, abdominal pain, problems with bowel movements, urination, or intercourse(not active). No joint pain or mood swings.  Periods are irregular, has not had one since May, and it was mostly brown clots for 3 weeks then Feels hot at night  Heart skips at times, has seen cardiology Has gained some weight back  Physical Exam:BP (!) 132/93 (BP Location: Left Arm, Patient Position: Sitting, Cuff Size: Normal)   Pulse 80   Ht 5' 3 (1.6 m)   Wt 161 lb (73 kg)   LMP 07/28/2023 (Approximate)   BMI 28.52 kg/m   General:  Well developed, well nourished, no acute distress Skin:  Warm and dry Neck:  Midline trachea, normal thyroid , good ROM, no lymphadenopathy Lungs; Clear to auscultation  bilaterally Breast:  No dominant palpable mass, retraction, or nipple discharge Cardiovascular: Regular rate and rhythm Abdomen:  Soft, non tender, no hepatosplenomegaly Pelvic:  External genitalia is normal in appearance, no lesions.  The vagina is normal in appearance. Urethra has no lesions or masses. The cervix is smooth.  Uterus is felt to be normal size, shape, and contour.  No adnexal masses or tenderness noted.Bladder is non tender, no masses felt. Rectal:Deferred Extremities/musculoskeletal:  No swelling or varicosities noted, no clubbing or cyanosis Psych:  No mood changes, alert and cooperative,seems happy Fall risk is low  Upstream - 01/14/24 1433       Pregnancy Intention Screening   Does the patient want to become pregnant in the next year? No    Does the patient's partner want to become pregnant in the next year? No    Would the patient like to discuss contraceptive options today? No      Contraception Wrap Up   Current Method Abstinence    End Method Abstinence    Contraception Counseling Provided No         Examination chaperoned by Clarita Salt LPN  Impression and plan: 1. Encounter for well woman exam with routine gynecological exam (Primary) Pap in 2027 Physical in 1 year Labs with PCP Mammogram today Cologuard was negative 11/2023  2. Irregular periods Last period was in May  3. Peri-menopause  4. Elevated BP without diagnosis of hypertension Keep check on BP

## 2024-01-19 ENCOUNTER — Ambulatory Visit: Payer: Self-pay | Admitting: Adult Health

## 2024-01-30 ENCOUNTER — Telehealth: Payer: Self-pay | Admitting: Internal Medicine

## 2024-01-30 NOTE — Telephone Encounter (Signed)
 Contacted and offered a sooner appointment to see Mallipeddi on Friday, 02/27/2024 @11 :20 am at the [place] office. Appointment accepted and 03/04/24 appointment with Miriam canceled. Reports having palpitations more frequently. Denies dizziness, chest pain or SOB. ER precautions provided. Verbalized understanding of plan.

## 2024-01-30 NOTE — Telephone Encounter (Signed)
 Patient c/o Palpitations:  STAT if patient reporting lightheadedness, shortness of breath, or chest pain  How long have you had palpitations/irregular HR/ Afib? Are you having the symptoms now? Worsened over the past few months, pt has symptoms now but says they are not bad   Are you currently experiencing lightheadedness, SOB or CP? No   Do you have a history of afib (atrial fibrillation) or irregular heart rhythm? No not that pt knows of   Have you checked your BP or HR? (document readings if available): No   Are you experiencing any other symptoms? No     Pt states she has continued having irregular heart beat since her last appt with Dr. Mallipeddi 11/09/22. Frequency of these episodes have increased. Pt scheduled with E. Miriam 12/18 for overdue annual. Please advise.

## 2024-02-27 ENCOUNTER — Ambulatory Visit: Attending: Internal Medicine

## 2024-02-27 ENCOUNTER — Other Ambulatory Visit: Payer: Self-pay | Admitting: Internal Medicine

## 2024-02-27 ENCOUNTER — Ambulatory Visit: Attending: Internal Medicine | Admitting: Internal Medicine

## 2024-02-27 ENCOUNTER — Encounter: Payer: Self-pay | Admitting: Internal Medicine

## 2024-02-27 VITALS — BP 122/82 | HR 82 | Ht 62.0 in | Wt 156.8 lb

## 2024-02-27 DIAGNOSIS — E7841 Elevated Lipoprotein(a): Secondary | ICD-10-CM | POA: Diagnosis not present

## 2024-02-27 DIAGNOSIS — R002 Palpitations: Secondary | ICD-10-CM

## 2024-02-27 DIAGNOSIS — Z8249 Family history of ischemic heart disease and other diseases of the circulatory system: Secondary | ICD-10-CM

## 2024-02-27 NOTE — Progress Notes (Signed)
 Cardiology Office Note  Date: 02/27/2024   ID: Tammy Blankenship, DOB 12/19/1970, MRN [id number]  PCP:  Tobie Suzzane POUR, MD  Cardiologist:  Diannah SHAUNNA Maywood, MD Electrophysiologist:  None   History of Present Illness: Tammy Blankenship is a 53 y.o. female known to have vitamin D  deficiency is here today for follow-up visit of HLD.  August 2024: Patient had palpitations for which she was referred to cardiology clinic in 2021. She underwent event monitor that was essentially unremarkable except for 2 brief episodes of SVT.  She did not have any palpitations since 2021 until recently.  She also noticed that her palpitations occur around the time of her menstrual cycles.  Interval between 2 menstrual cycles was between 5 and 6 months.  These palpitations eventually resolve after the menses.  She also had chest pain consistently for 1 week prior to ER visit on 09/17/2022.  EKG showed no ischemia, NSR.  Troponins were within normal limits, 1 set was obtained.  No recurrence of chest pain since the ER visit.  She also reported having some chest pains when she has palpitations.  This chest pain certainly positional, occurred at rest, especially at night and improves with positional changes.  No chest pain with exertion, she is active at baseline, does yard work and exercises with no symptoms of chest pain or DOE.  She has a family history of CAD, her brother had 7 stents.  Patient is here today for follow-up visit.  CT coronary calcium  scoring is 0.  Previously palpitations were thought to be from perimenopause.  But she has recurrence of palpitations and now occurring more frequently than before.  Chest pain is improved.  No DOE.  No dizziness, syncope.  No leg swelling.  Past Medical History:  Diagnosis Date   Fibroid 09/22/2015   History of ovarian cyst 09/15/2015   Hypothyroidism    PCO (polycystic ovaries)    Vitamin B12 deficiency    Vitamin D  deficiency 04/07/2018   Take 5000 IU daily     Past Surgical History:  Procedure Laterality Date   FLEXIBLE SIGMOIDOSCOPY  12/14/2010   Procedure: FLEXIBLE SIGMOIDOSCOPY;  Surgeon: Margo CHRISTELLA Haddock, MD;  [facility];  Service: Endoscopy;  Laterality: N/A;  8:30   TONSILLECTOMY      Current Outpatient Medications  Medication Sig Dispense Refill   Multiple Vitamin (MULTIVITAMIN) tablet Take 1 tablet by mouth daily.     rosuvastatin  (CRESTOR ) 10 MG tablet Take 10 mg by mouth every other day.     No current facility-administered medications for this visit.   Allergies:  Patient has no known allergies.   Social History: Never smoked  Family History: The patient's family history includes Atrial fibrillation in her mother; Breast cancer (age of onset: 57) in her mother; Cancer in her mother; Colon cancer in her maternal uncle; Dementia in her father; Diabetes in her sister; Parkinson's disease in her father; Rectal cancer in her maternal uncle.   ROS:  Please see the history of present illness. Otherwise, complete review of systems is positive for none  All other systems are reviewed and negative.   Physical Exam: VS:  Ht 5' 2 (1.575 m)   Wt 156 lb 12.8 oz (71.1 kg)   BMI 28.68 kg/m , BMI Body mass index is 28.68 kg/m.  Wt Readings from Last 3 Encounters:  02/27/24 156 lb 12.8 oz (71.1 kg)  01/14/24 161 lb (73 kg)  11/04/23 161 lb 9.6 oz (73.3  kg)    General: Patient appears comfortable at rest. HEENT: Conjunctiva and lids normal, oropharynx clear with moist mucosa. Neck: Supple, no elevated JVP or carotid bruits, no thyromegaly. Lungs: Clear to auscultation, nonlabored breathing at rest. Cardiac: Regular rate and rhythm, no S3 or significant systolic murmur, no pericardial rub. Abdomen: Soft, nontender, no hepatomegaly, bowel sounds present, no guarding or rebound. Extremities: No pitting edema, distal pulses 2+. Skin: Warm and dry. Musculoskeletal: No kyphosis. Neuropsychiatric: Alert and oriented x3, affect  grossly appropriate.  Recent Labwork: 11/04/2023: ALT 13; AST 21; BUN 13; Creatinine, Ser 0.70; Hemoglobin 14.7; Platelets 209; Potassium 4.8; Sodium 141; TSH 2.800     Component Value Date/Time   CHOL 173 11/04/2023 0849   TRIG 69 11/04/2023 0849   HDL 83 11/04/2023 0849   CHOLHDL 2.1 11/04/2023 0849   LDLCALC 77 11/04/2023 0849    Assessment and Plan:   Palpitations: Occurring more frequently now compared to before.  Few times per week.  Obtain 2-week event monitor.  Elevated lipoprotein a: LPA 155 in 2024.  She has a family history of CAD, her brother had 7 stents.  Currently on rosuvastatin  10 mg every other day.  LDL 77 from August 2025.  Discussed about starting Repatha due to elevated LPA levels.  Patient would like to do some research and will get back to me.  When she decides to start Repatha, okay to discontinue statin.   30-minute spent in reviewing prior medical records, reports, more than 3 labs, discussion and documentation  Medication Adjustments/Labs and Tests Ordered: Current medicines are reviewed at length with the patient today.  Concerns regarding medicines are outlined above.    Disposition:  Follow up 1 year  Signed Reon Hunley Priya Sherrita Riederer, MD, 02/27/2024 11:22 AM    [facility] [address] [place], [place], [place] [address]

## 2024-02-27 NOTE — Patient Instructions (Signed)
 Medication Instructions:  Your physician recommends that you continue on your current medications as directed. Please refer to the Current Medication list given to you today.   Labwork: None  Testing/Procedures: Your physician has recommended that you wear a Zio monitor.   This monitor is a medical device that records the hearts electrical activity. Doctors most often use these monitors to diagnose arrhythmias. Arrhythmias are problems with the speed or rhythm of the heartbeat. The monitor is a small device applied to your chest. You can wear one while you do your normal daily activities. While wearing this monitor if you have any symptoms to push the button and record what you felt. Once you have worn this monitor for the period of time provider prescribed (for 14 days), you will return the monitor device in the postage paid box. Once it is returned they will download the data collected and provide us  with a report which the provider will then review and we will call you with those results. Important tips:  Avoid showering during the first 24 hours of wearing the monitor. Avoid excessive sweating to help maximize wear time. Do not submerge the device, no hot tubs, and no swimming pools. Keep any lotions or oils away from the patch. After 24 hours you may shower with the patch on. Take brief showers with your back facing the shower head.  Do not remove patch once it has been placed because that will interrupt data and decrease adhesive wear time. Push the button when you have any symptoms and write down what you were feeling. Once you have completed wearing your monitor, remove and place into box which has postage paid and place in your outgoing mailbox.  If for some reason you have misplaced your box then call our office and we can provide another box and/or mail it off for you.   Follow-Up: Your physician recommends that you schedule a follow-up appointment in: 1 year. You will receive a  reminder call in about 8 months reminding you to schedule your appointment. If you don't receive this call, please contact our office.   Any Other Special Instructions Will Be Listed Below (If Applicable). [id number]- Repatha and Praluent 2nd option Leqvio  Thank you for choosing [facility]!     If you need a refill on your cardiac medications before your next appointment, please call your pharmacy.

## 2024-03-04 ENCOUNTER — Ambulatory Visit: Admitting: Nurse Practitioner

## 2024-03-04 DIAGNOSIS — E7841 Elevated Lipoprotein(a): Secondary | ICD-10-CM | POA: Insufficient documentation

## 2024-11-09 ENCOUNTER — Encounter: Admitting: Internal Medicine
